# Patient Record
Sex: Female | Born: 1955 | Race: White | Hispanic: No | Smoking: Current every day smoker
Health system: Southern US, Community
[De-identification: ages and names within clinical notes are randomized; demographics above are authoritative.]

## PROBLEM LIST (undated history)

## (undated) DIAGNOSIS — F419 Anxiety disorder, unspecified: Secondary | ICD-10-CM

## (undated) DIAGNOSIS — I739 Peripheral vascular disease, unspecified: Secondary | ICD-10-CM

## (undated) DIAGNOSIS — M1990 Unspecified osteoarthritis, unspecified site: Secondary | ICD-10-CM

## (undated) DIAGNOSIS — I639 Cerebral infarction, unspecified: Secondary | ICD-10-CM

## (undated) DIAGNOSIS — I8290 Acute embolism and thrombosis of unspecified vein: Secondary | ICD-10-CM

## (undated) DIAGNOSIS — E039 Hypothyroidism, unspecified: Secondary | ICD-10-CM

## (undated) HISTORY — PX: ABDOMINAL HYSTERECTOMY: SHX81

## (undated) HISTORY — PX: APPENDECTOMY: SHX54

## (undated) HISTORY — PX: COSMETIC SURGERY: SHX468

---

## 2001-12-30 ENCOUNTER — Ambulatory Visit (HOSPITAL_COMMUNITY): Admission: RE | Admit: 2001-12-30 | Discharge: 2001-12-30 | Payer: Self-pay | Admitting: Neurology

## 2002-11-27 ENCOUNTER — Ambulatory Visit (HOSPITAL_COMMUNITY): Admission: RE | Admit: 2002-11-27 | Discharge: 2002-11-27 | Payer: Self-pay | Admitting: Neurology

## 2003-01-21 ENCOUNTER — Ambulatory Visit (HOSPITAL_COMMUNITY): Admission: RE | Admit: 2003-01-21 | Discharge: 2003-01-21 | Payer: Self-pay | Admitting: Neurology

## 2003-01-21 ENCOUNTER — Encounter: Payer: Self-pay | Admitting: Neurology

## 2003-03-18 ENCOUNTER — Ambulatory Visit (HOSPITAL_COMMUNITY): Admission: RE | Admit: 2003-03-18 | Discharge: 2003-03-18 | Payer: Self-pay | Admitting: Neurology

## 2003-03-18 ENCOUNTER — Encounter: Payer: Self-pay | Admitting: Neurology

## 2011-11-10 ENCOUNTER — Encounter (HOSPITAL_COMMUNITY): Payer: Self-pay | Admitting: Certified Registered"

## 2011-11-10 ENCOUNTER — Other Ambulatory Visit: Payer: Self-pay

## 2011-11-10 ENCOUNTER — Encounter (HOSPITAL_COMMUNITY)
Admission: EM | Disposition: A | Discharge status: Home / Self Care | Payer: Self-pay | Source: Home / Self Care | Attending: Orthopedic Surgery

## 2011-11-10 ENCOUNTER — Encounter (HOSPITAL_COMMUNITY): Payer: Self-pay | Admitting: Emergency Medicine

## 2011-11-10 ENCOUNTER — Inpatient Hospital Stay (HOSPITAL_COMMUNITY)
Admission: EM | Admit: 2011-11-10 | Discharge: 2011-11-13 | DRG: 482 | Disposition: A | Discharge status: Home / Self Care | Payer: Medicaid Other | Attending: Orthopedic Surgery | Admitting: Orthopedic Surgery

## 2011-11-10 ENCOUNTER — Emergency Department (HOSPITAL_COMMUNITY): Payer: Medicaid Other | Admitting: Certified Registered"

## 2011-11-10 DIAGNOSIS — M25559 Pain in unspecified hip: Secondary | ICD-10-CM | POA: Diagnosis present

## 2011-11-10 DIAGNOSIS — W19XXXA Unspecified fall, initial encounter: Secondary | ICD-10-CM | POA: Diagnosis present

## 2011-11-10 DIAGNOSIS — S72143A Displaced intertrochanteric fracture of unspecified femur, initial encounter for closed fracture: Principal | ICD-10-CM | POA: Diagnosis present

## 2011-11-10 DIAGNOSIS — Y998 Other external cause status: Secondary | ICD-10-CM

## 2011-11-10 DIAGNOSIS — Z8673 Personal history of transient ischemic attack (TIA), and cerebral infarction without residual deficits: Secondary | ICD-10-CM

## 2011-11-10 DIAGNOSIS — F17200 Nicotine dependence, unspecified, uncomplicated: Secondary | ICD-10-CM | POA: Diagnosis present

## 2011-11-10 DIAGNOSIS — S72142A Displaced intertrochanteric fracture of left femur, initial encounter for closed fracture: Secondary | ICD-10-CM

## 2011-11-10 HISTORY — PX: FEMUR IM NAIL: SHX1597

## 2011-11-10 HISTORY — DX: Cerebral infarction, unspecified: I63.9

## 2011-11-10 SURGERY — INSERTION, INTRAMEDULLARY ROD, FEMUR
Anesthesia: General | Site: Hip | Laterality: Left | Wound class: Clean

## 2011-11-10 MED ORDER — FENTANYL CITRATE 0.05 MG/ML IJ SOLN
INTRAMUSCULAR | Status: DC | PRN
  Administered 2011-11-10: 50 ug via INTRAVENOUS

## 2011-11-10 MED ORDER — MIDAZOLAM HCL 5 MG/5ML IJ SOLN
INTRAMUSCULAR | Status: DC | PRN
  Administered 2011-11-10: 1 mg via INTRAVENOUS

## 2011-11-10 MED ORDER — LACTATED RINGERS IV SOLN
INTRAVENOUS | Status: DC | PRN
  Administered 2011-11-10 – 2011-11-11 (×2): via INTRAVENOUS

## 2011-11-10 MED ORDER — CEFAZOLIN SODIUM 1-5 GM-% IV SOLN
INTRAVENOUS | Status: DC | PRN
  Administered 2011-11-10: 1 g via INTRAVENOUS

## 2011-11-10 MED ORDER — SUCCINYLCHOLINE CHLORIDE 20 MG/ML IJ SOLN
INTRAMUSCULAR | Status: DC | PRN
  Administered 2011-11-10: 80 mg via INTRAVENOUS

## 2011-11-10 MED ORDER — EPHEDRINE SULFATE 50 MG/ML IJ SOLN
INTRAMUSCULAR | Status: DC | PRN
  Administered 2011-11-10: 5 mg via INTRAVENOUS
  Administered 2011-11-10: 10 mg via INTRAVENOUS

## 2011-11-10 MED ORDER — ROCURONIUM BROMIDE 100 MG/10ML IV SOLN
INTRAVENOUS | Status: DC | PRN
  Administered 2011-11-10: 20 mg via INTRAVENOUS

## 2011-11-10 MED ORDER — PROPOFOL 10 MG/ML IV EMUL
INTRAVENOUS | Status: DC | PRN
  Administered 2011-11-10: 70 mg via INTRAVENOUS

## 2011-11-10 SURGICAL SUPPLY — 40 items
BANDAGE CONFORM 3  STR LF (GAUZE/BANDAGES/DRESSINGS) ×4 IMPLANT
BIT DRILL CANN LG 4.3MM (BIT) ×1 IMPLANT
BLADE SURG 15 STRL LF DISP TIS (BLADE) IMPLANT
BLADE SURG 15 STRL SS (BLADE)
CLOTH BEACON ORANGE TIMEOUT ST (SAFETY) ×2 IMPLANT
DRAPE STERI IOBAN 125X83 (DRAPES) ×2 IMPLANT
DRILL BIT CANN LG 4.3MM (BIT) ×2
DRSG ADAPTIC 3X8 NADH LF (GAUZE/BANDAGES/DRESSINGS) ×2 IMPLANT
DRSG MEPILEX BORDER 4X4 (GAUZE/BANDAGES/DRESSINGS) ×3 IMPLANT
DRSG MEPILEX BORDER 4X8 (GAUZE/BANDAGES/DRESSINGS) ×3 IMPLANT
ELECT REM PT RETURN 9FT ADLT (ELECTROSURGICAL) ×2
ELECTRODE REM PT RTRN 9FT ADLT (ELECTROSURGICAL) ×1 IMPLANT
GLOVE BIO SURGEON STRL SZ7.5 (GLOVE) ×2 IMPLANT
GLOVE BIO SURGEON STRL SZ8 (GLOVE) ×2 IMPLANT
GLOVE EUDERMIC 7 POWDERFREE (GLOVE) ×2 IMPLANT
GLOVE SS BIOGEL STRL SZ 7.5 (GLOVE) ×1 IMPLANT
GLOVE SUPERSENSE BIOGEL SZ 7.5 (GLOVE) ×1
GOWN STRL NON-REIN LRG LVL3 (GOWN DISPOSABLE) ×2 IMPLANT
GOWN STRL REIN XL XLG (GOWN DISPOSABLE) ×4 IMPLANT
GUIDEPIN 3.2X17.5 THRD DISP (PIN) ×1 IMPLANT
GUIDEWIRE BALL NOSE 100CM (WIRE) ×1 IMPLANT
KIT BASIN OR (CUSTOM PROCEDURE TRAY) ×2 IMPLANT
KIT ROOM TURNOVER OR (KITS) ×2 IMPLANT
MANIFOLD NEPTUNE II (INSTRUMENTS) ×2 IMPLANT
NAIL HIP FRACT 130D 9X180 (Orthopedic Implant) ×1 IMPLANT
NS IRRIG 1000ML POUR BTL (IV SOLUTION) ×2 IMPLANT
PACK GENERAL/GYN (CUSTOM PROCEDURE TRAY) ×2 IMPLANT
PAD ARMBOARD 7.5X6 YLW CONV (MISCELLANEOUS) ×4 IMPLANT
SCREW BONE CORTICAL 5.0X3 (Screw) ×1 IMPLANT
SCREW LAG HIP NAIL 10.5X95 (Screw) ×1 IMPLANT
SPONGE LAP 18X18 X RAY DECT (DISPOSABLE) ×2 IMPLANT
STAPLER VISISTAT 35W (STAPLE) ×4 IMPLANT
STRIP CLOSURE SKIN 1/2X4 (GAUZE/BANDAGES/DRESSINGS) ×2 IMPLANT
SUT MNCRL AB 3-0 PS2 18 (SUTURE) ×2 IMPLANT
SUT VIC AB 0 CT1 27 (SUTURE) ×2
SUT VIC AB 0 CT1 27XBRD ANBCTR (SUTURE) ×1 IMPLANT
SUT VIC AB 2-0 CT1 27 (SUTURE) ×2
SUT VIC AB 2-0 CT1 TAPERPNT 27 (SUTURE) ×1 IMPLANT
TRAY FOLEY CATH 14FR (SET/KITS/TRAYS/PACK) IMPLANT
WATER STERILE IRR 1000ML POUR (IV SOLUTION) ×2 IMPLANT

## 2011-11-10 NOTE — ED Notes (Signed)
Pt brought in via [facility] from [facility]. For left hip fx. Pt states no pain unless she moves the leg. States she tripped in a parking lot and fell.

## 2011-11-10 NOTE — ED Notes (Signed)
Dr. Rennis Chris here to assess patient for OR.  Jewelry and sports bra removed.  Remains NPO.

## 2011-11-10 NOTE — ED Provider Notes (Signed)
History     CSN: [id number]  Arrival date & time 11/10/11  1715   First MD Initiated Contact with Patient 11/10/11 1722      Chief Complaint  Patient presents with  . Hip Injury    (Consider location/radiation/quality/duration/timing/severity/associated sxs/prior treatment) Patient is a 56 y.o. female presenting with fall. The history is provided by the patient.  Fall The accident occurred 3 to 5 hours ago. The fall occurred while walking. She fell from a height of 3 to 5 ft. She landed on concrete. There was no blood loss. The point of impact was the left hip. The pain is present in the left hip. The pain is at a severity of 3/10. The pain is moderate. She was not ambulatory at the scene. There was no entrapment after the fall. Pertinent negatives include no fever, no abdominal pain, no nausea, no vomiting, no headaches and no loss of consciousness. The symptoms are aggravated by activity and use of the injured limb.    Past Medical History  Diagnosis Date  . Stroke     Past Surgical History  Procedure Date  . Abdominal hysterectomy   . Cosmetic surgery   . Appendectomy     History reviewed. No pertinent family history.  History  Substance Use Topics  . Smoking status: Current Everyday Smoker  . Smokeless tobacco: Not on file  . Alcohol Use: No    OB History    Grav Para Term Preterm Abortions TAB SAB Ect Mult Living                  Review of Systems  Constitutional: Negative for fever and chills.  HENT: Negative for congestion and rhinorrhea.   Respiratory: Negative for shortness of breath.   Cardiovascular: Negative for chest pain and leg swelling.  Gastrointestinal: Negative for nausea, vomiting, abdominal pain and constipation.  Genitourinary: Negative for urgency, decreased urine volume and difficulty urinating.  Musculoskeletal:       Left hip pain  Skin: Negative for wound.  Neurological: Negative for loss of consciousness, syncope, weakness and  headaches.  Hematological: Does not bruise/bleed easily.  Psychiatric/Behavioral: Negative for confusion.  All other systems reviewed and are negative.    Allergies  Amoxicillin  Home Medications   Current Outpatient Rx  Name Route Sig Dispense Refill  . DOXYLAMINE-DM 6.25-15 MG/15ML PO LIQD Oral Take 5 mLs by mouth daily as needed. For cough flu symptoms    . GUAIFENESIN ER 600 MG PO TB12 Oral Take 600 mg by mouth daily as needed. For allergies per patient    . MELATONIN 3 MG PO TABS Oral Take 1 tablet by mouth at bedtime.      BP 113/56  Pulse 69  Temp(Src) 98 F (36.7 C) (Oral)  Resp 8  SpO2 92%  Physical Exam  Nursing note and vitals reviewed. Constitutional: She is oriented to person, place, and time. She appears well-developed and well-nourished. No distress.  HENT:  Head: Normocephalic and atraumatic.  Right Ear: External ear normal.  Left Ear: External ear normal.  Nose: Nose normal.  Mouth/Throat: Oropharynx is clear and moist.  Eyes: Pupils are equal, round, and reactive to light.  Neck: Neck supple.  Cardiovascular: Normal rate, regular rhythm, normal heart sounds and intact distal pulses.   Pulmonary/Chest: Effort normal and breath sounds normal. No respiratory distress. She has no wheezes. She has no rales.  Abdominal: Soft. She exhibits no distension. There is no tenderness.  Musculoskeletal: She exhibits no edema.  Left hip: She exhibits decreased range of motion and tenderness. She exhibits no deformity and no laceration.       Legs:      Normal neurovascular status in left leg  Lymphadenopathy:    She has no cervical adenopathy.  Neurological: She is alert and oriented to person, place, and time.  Skin: Skin is warm and dry. She is not diaphoretic. No pallor.    ED Course  Procedures (including critical care time)  Labs Reviewed - No data to display No results found.   Date: 11/10/2011  Rate: 68  Rhythm: normal sinus rhythm  QRS Axis:  normal  Intervals: normal  ST/T Wave abnormalities: normal  Conduction Disutrbances:none  Narrative Interpretation:   Old EKG Reviewed: none available   1. Intertrochanteric fracture of left hip       MDM  56 yo female presents as transfer from [place] after a fall outside of a store. Left intertroch hip fracture. Accepted by Dr. Rennis Chris. Labs reveiwed from OSH, pertinent for anemia (Hgb 11.1) and hypokalemia (3.2). Pain controlled now, does not need more pain meds (3/10 at initial visit to our ED). Dr. Rennis Chris saw patient in ED. Will admit patient to ortho service, and will likely take to OR tonight. Xrays from outside hospital reviewed by myself. Doubt syncope as cause of her fall, but EKG shows no ischemic changes. No symptoms suggesting ACS as cause of fall.        Pricilla Loveless, MD 11/10/11 [phone]

## 2011-11-10 NOTE — Preoperative (Signed)
Beta Blockers   Reason not to administer Beta Blockers:Not Applicable 

## 2011-11-10 NOTE — Anesthesia Preprocedure Evaluation (Addendum)
Anesthesia Evaluation  Patient identified by MRN, date of birth, ID band Patient awake    Reviewed: Allergy & Precautions, H&P , NPO status , Patient's Chart, lab work & pertinent test results  History of Anesthesia Complications Negative for: history of anesthetic complications  Airway Mallampati: I TM Distance: >3 FB Neck ROM: Full    Dental  (+) Edentulous Upper and Edentulous Lower   Pulmonary Recent URI , Residual Cough, Current Smoker,  breath sounds clear to auscultation  Pulmonary exam normal       Cardiovascular negative cardio ROS  Rhythm:Regular Rate:Normal     Neuro/Psych TIAnegative psych ROS   GI/Hepatic negative GI ROS, Neg liver ROS,   Endo/Other  negative endocrine ROS  Renal/GU negative Renal ROS     Musculoskeletal   Abdominal   Peds  Hematology negative hematology ROS (+)   Anesthesia Other Findings   Reproductive/Obstetrics                           Anesthesia Physical Anesthesia Plan  ASA: II  Anesthesia Plan: General   Post-op Pain Management:    Induction: Intravenous and Rapid sequence  Airway Management Planned: Oral ETT  Additional Equipment:   Intra-op Plan:   Post-operative Plan: Extubation in OR  Informed Consent:   Plan Discussed with: CRNA and Surgeon  Anesthesia Plan Comments: (Plan routine monitors, GETA with RSI)        Anesthesia Quick Evaluation

## 2011-11-10 NOTE — H&P (Signed)
  PREOPERATIVE H&P  Chief Complaint: Left hip pain  HPI: Carrie Adams is a 56 y.o. female who presents for evaluation of left hip pain after a fall. This happened earlier today and she was evaluated at [facility] and transferred here for surgical management.  Past Medical History  Diagnosis Date  . Stroke    Past Surgical History  Procedure Date  . Abdominal hysterectomy   . Cosmetic surgery   . Appendectomy    History   Social History  . Marital Status: Single    Spouse Name: N/A    Number of Children: N/A  . Years of Education: N/A   Social History Main Topics  . Smoking status: Current Everyday Smoker  . Smokeless tobacco: None  . Alcohol Use: No  . Drug Use: No  . Sexually Active:    Other Topics Concern  . None   Social History Narrative  . None   History reviewed. No pertinent family history. Allergies  Allergen Reactions  . Amoxicillin Nausea And Vomiting   Prior to Admission medications   Medication Sig Start Date End Date Taking? Authorizing Provider  Doxylamine-DM (NIGHT-TIME COUGH) 6.25-15 MG/15ML LIQD Take 5 mLs by mouth daily as needed. For cough flu symptoms   Yes Historical Provider, MD  guaiFENesin (MUCINEX) 600 MG 12 hr tablet Take 600 mg by mouth daily as needed. For allergies per patient   Yes Historical Provider, MD  Melatonin 3 MG TABS Take 1 tablet by mouth at bedtime.   Yes Historical Provider, MD     Positive ROS: denies any recent health issues  All other systems have been reviewed and were otherwise negative with the exception of those mentioned in the HPI and as above.  Physical Exam: Filed Vitals:   11/10/11 2015  BP: 104/58  Pulse: 68  Temp:   Resp: 10    General: Alert, no acute distress Cardiovascular: No pedal edema Respiratory: No cyanosis, no use of accessory musculature GI: No organomegaly, abdomen is soft and non-tender Skin: No lesions in the area of chief complaint Neurologic: Sensation intact  distally Psychiatric: Patient is competent for consent with normal mood and affect Lymphatic: No axillary or cervical lymphadenopathy  MUSCULOSKELETAL: Left hip guarded and painful with any attempted movement. Does tolerate foot and ankle DF/PF and sensation is intct  Assessment/Plan: Left intertroch hip fracture Plan for Procedure(s): INTRAMEDULLARY (IM) NAIL FEMORAL  The risks benefits and treatment options were reviewed including possible complications of bleeding, infection, neurovascular injury, ongoing pain, anesthetic complication, and possible need for additional surgery. The patient understands, accepts and agrees to proceed with the planned procedure.  SHUFORD,TRACY, PA 11/10/2011 9:17 PM

## 2011-11-10 NOTE — ED Notes (Signed)
Report called to Anesthesia and patient transported to OR.

## 2011-11-11 ENCOUNTER — Emergency Department (HOSPITAL_COMMUNITY): Payer: Medicaid Other

## 2011-11-11 LAB — CBC
HCT: 31.2 % — ABNORMAL LOW (ref 36.0–46.0)
Hemoglobin: 10 g/dL — ABNORMAL LOW (ref 12.0–15.0)
MCH: 28.6 pg (ref 26.0–34.0)
MCHC: 32.1 g/dL (ref 30.0–36.0)
MCV: 89.1 fL (ref 78.0–100.0)
Platelets: 288 10*3/uL (ref 150–400)
RBC: 3.5 MIL/uL — ABNORMAL LOW (ref 3.87–5.11)
RDW: 15.9 % — ABNORMAL HIGH (ref 11.5–15.5)
WBC: 11.8 10*3/uL — ABNORMAL HIGH (ref 4.0–10.5)

## 2011-11-11 MED ORDER — METHOCARBAMOL 100 MG/ML IJ SOLN
500.0000 mg | Freq: Four times a day (QID) | INTRAMUSCULAR | Status: DC | PRN
  Filled 2011-11-11: qty 5

## 2011-11-11 MED ORDER — OXYCODONE-ACETAMINOPHEN 5-325 MG PO TABS
1.0000 | ORAL_TABLET | ORAL | Status: DC | PRN
  Administered 2011-11-11: 2 via ORAL
  Administered 2011-11-11: 1 via ORAL
  Administered 2011-11-11 – 2011-11-13 (×9): 2 via ORAL
  Filled 2011-11-11 (×11): qty 2

## 2011-11-11 MED ORDER — DIPHENHYDRAMINE HCL 25 MG PO CAPS
25.0000 mg | ORAL_CAPSULE | Freq: Four times a day (QID) | ORAL | Status: DC | PRN
  Administered 2011-11-12: 25 mg via ORAL
  Filled 2011-11-11: qty 1

## 2011-11-11 MED ORDER — GLYCOPYRROLATE 0.2 MG/ML IJ SOLN
INTRAMUSCULAR | Status: DC | PRN
  Administered 2011-11-11: .4 mg via INTRAVENOUS

## 2011-11-11 MED ORDER — METOCLOPRAMIDE HCL 10 MG PO TABS: 5.0000 mg | ORAL_TABLET | Freq: Three times a day (TID) | ORAL | Status: DC | PRN

## 2011-11-11 MED ORDER — SODIUM CHLORIDE 0.9 % IV SOLN: INTRAVENOUS | Status: DC

## 2011-11-11 MED ORDER — METHOCARBAMOL 100 MG/ML IJ SOLN: 500.0000 mg | Freq: Four times a day (QID) | INTRAMUSCULAR | Status: DC | PRN

## 2011-11-11 MED ORDER — HYDROMORPHONE HCL 1 MG/ML IJ SOLN
0.5000 mg | INTRAMUSCULAR | Status: DC | PRN
  Administered 2011-11-11: 0.5 mg via INTRAVENOUS
  Filled 2011-11-11: qty 1

## 2011-11-11 MED ORDER — HYDROCODONE-ACETAMINOPHEN 5-325 MG PO TABS: 1.0000 | ORAL_TABLET | ORAL | Status: DC | PRN

## 2011-11-11 MED ORDER — METHOCARBAMOL 500 MG PO TABS
500.0000 mg | ORAL_TABLET | Freq: Four times a day (QID) | ORAL | Status: DC | PRN
Start: 2011-11-11 — End: 2011-11-11

## 2011-11-11 MED ORDER — CEFAZOLIN SODIUM 1-5 GM-% IV SOLN
1.0000 g | Freq: Four times a day (QID) | INTRAVENOUS | Status: AC
  Administered 2011-11-11 (×3): 1 g via INTRAVENOUS
  Filled 2011-11-11 (×3): qty 50

## 2011-11-11 MED ORDER — METHOCARBAMOL 500 MG PO TABS
500.0000 mg | ORAL_TABLET | Freq: Four times a day (QID) | ORAL | Status: DC | PRN
  Administered 2011-11-11 – 2011-11-12 (×4): 500 mg via ORAL
  Filled 2011-11-11 (×4): qty 1

## 2011-11-11 MED ORDER — ONDANSETRON HCL 4 MG PO TABS: 4.0000 mg | ORAL_TABLET | Freq: Four times a day (QID) | ORAL | Status: DC | PRN

## 2011-11-11 MED ORDER — DEXTROSE-NACL 5-0.45 % IV SOLN
INTRAVENOUS | Status: DC
  Administered 2011-11-11: 03:00:00 via INTRAVENOUS

## 2011-11-11 MED ORDER — HYDROMORPHONE HCL PF 1 MG/ML IJ SOLN: 0.2500 mg | INTRAMUSCULAR | Status: DC | PRN

## 2011-11-11 MED ORDER — PHENOL 1.4 % MT LIQD: 1.0000 | OROMUCOSAL | Status: DC | PRN

## 2011-11-11 MED ORDER — ACETAMINOPHEN 325 MG PO TABS: 650.0000 mg | ORAL_TABLET | Freq: Four times a day (QID) | ORAL | Status: DC | PRN

## 2011-11-11 MED ORDER — MORPHINE SULFATE 2 MG/ML IJ SOLN: 0.5000 mg | INTRAMUSCULAR | Status: DC | PRN

## 2011-11-11 MED ORDER — NEOSTIGMINE METHYLSULFATE 1 MG/ML IJ SOLN
INTRAMUSCULAR | Status: DC | PRN
  Administered 2011-11-11: 2 mg via INTRAVENOUS

## 2011-11-11 MED ORDER — ONDANSETRON HCL 4 MG/2ML IJ SOLN: 4.0000 mg | Freq: Four times a day (QID) | INTRAMUSCULAR | Status: DC | PRN

## 2011-11-11 MED ORDER — DOCUSATE SODIUM 100 MG PO CAPS
100.0000 mg | ORAL_CAPSULE | Freq: Two times a day (BID) | ORAL | Status: DC
  Administered 2011-11-11 – 2011-11-13 (×4): 100 mg via ORAL
  Filled 2011-11-11 (×6): qty 1

## 2011-11-11 MED ORDER — ACETAMINOPHEN 650 MG RE SUPP: 650.0000 mg | Freq: Four times a day (QID) | RECTAL | Status: DC | PRN

## 2011-11-11 MED ORDER — ZOLPIDEM TARTRATE 5 MG PO TABS: 5.0000 mg | ORAL_TABLET | Freq: Every evening | ORAL | Status: DC | PRN

## 2011-11-11 MED ORDER — ENOXAPARIN SODIUM 40 MG/0.4ML ~~LOC~~ SOLN
40.0000 mg | SUBCUTANEOUS | Status: DC
  Administered 2011-11-11 – 2011-11-12 (×2): 40 mg via SUBCUTANEOUS
  Filled 2011-11-11 (×3): qty 0.4

## 2011-11-11 MED ORDER — ONDANSETRON HCL 4 MG/2ML IJ SOLN
INTRAMUSCULAR | Status: DC | PRN
  Administered 2011-11-11: 4 mg via INTRAVENOUS

## 2011-11-11 MED ORDER — MENTHOL 3 MG MT LOZG: 1.0000 | LOZENGE | OROMUCOSAL | Status: DC | PRN

## 2011-11-11 MED ORDER — METOCLOPRAMIDE HCL 5 MG/ML IJ SOLN: 5.0000 mg | Freq: Three times a day (TID) | INTRAMUSCULAR | Status: DC | PRN

## 2011-11-11 NOTE — Anesthesia Procedure Notes (Signed)
Procedure Name: Intubation Date/Time: 11/10/2011 11:34 PM Performed by: Julianne Rice Z Pre-anesthesia Checklist: Patient identified, Timeout performed, Emergency Drugs available, Suction available and Patient being monitored Patient Re-evaluated:Patient Re-evaluated prior to inductionOxygen Delivery Method: Circle system utilized Preoxygenation: Pre-oxygenation with 100% oxygen Intubation Type: IV induction Ventilation: Mask ventilation without difficulty Laryngoscope Size: Mac and 3 Grade View: Grade I Tube type: Oral Number of attempts: 1 Airway Equipment and Method: Stylet and LTA kit utilized Placement Confirmation: ETT inserted through vocal cords under direct vision,  breath sounds checked- equal and bilateral and positive ETCO2 Secured at: 22 cm Tube secured with: Tape Dental Injury: Teeth and Oropharynx as per pre-operative assessment

## 2011-11-11 NOTE — ED Provider Notes (Signed)
I saw and evaluated the patient, reviewed the resident's note and I agree with the findings and plan. Pt s/p fall, hip pain, it fx on xrays from outside facility. Ortho called.   Suzi Roots, MD 11/11/11 1538

## 2011-11-11 NOTE — Op Note (Signed)
Carrie Adams, Carrie Adams NO.:  [id number]  MEDICAL RECORD NO.:  [id number]  LOCATION:  5006                         FACILITY:  [facility]  PHYSICIAN:  Vania Rea. Kerah Hardebeck, M.D.  DATE OF BIRTH:  09/03/1955  DATE OF PROCEDURE:  11/11/2011 DATE OF DISCHARGE:                              OPERATIVE REPORT   PREOPERATIVE DIAGNOSIS:  Left intertrochanteric hip fracture.  POSTOPERATIVE DIAGNOSIS:  Left intertrochanteric hip fracture.  PROCEDURE:  Open reduction and internal fixation of left intertrochanteric hip fracture utilizing intramedullary hip screw.  SURGEON:  Vania Rea. Aleka Twitty, M.D.  Threasa HeadsFrench Ana A. Adams, P.A.-C.  ANESTHESIA:  General endotracheal.  ESTIMATED BLOOD LOSS:  300 mL.  DRAINS:  None.  HISTORY:  Ms. Carrie Adams is a 56 year old female who fell earlier today, injuring the left hip with immediate complaints of pain and inability to bear weight.  She was taken [facility] where radiographs showed a left intertrochanteric hip fracture.  There was no orthopedic coverage available, so she was transferred to [facility] where upon evaluation, she was found to have diffuse tenderness with palpation of the left hip with pain on motion. The radiographs did confirm a left intertrochanteric hip fracture.  She is brought to the operating room at this time for planned open reduction and internal fixation.  Preoperatively, I counseled Ms. Carrie Adams on treatment options as well as risks versus benefits thereof.  Possible surgical complications were reviewed including potential for bleeding, infection, neurovascular injury, DVT, PE, malunion, nonunion, loss of fixation, and possible need for additional surgery.  She understands and accepts and agrees with our planned procedure.  PROCEDURE IN DETAIL:  After undergoing routine preop evaluation, the patient received prophylactic antibiotics.  Brought to the operating room on hospital bed and underwent smooth  induction of a general endotracheal anesthesia.  She was then transferred to the fracture table in supine position and appropriately padded and protected.  Left leg was placed in a gentle longitudinal traction, and right leg was placed in a well leg holder and appropriately padded and protected.  Fluoroscopic images were then obtained, which confirmed an appropriate reduction of the fracture site.  The left hip girdle region was then sterilely prepped and draped in standard fashion.  Time-out was called.  A 4-cm lateral incision was made just proximal to the tip of the greater trochanter with dissection carried sharply down through skin and subcu and deep fascia.  I then gained access to the tip of the greater trochanter, which was approach with a starting awl, which was introduced into the proximal femur with proper positioning confirmed fluoroscopically.  A ball-tip guidewire was then directed into the femoral shaft, and proper positioning was then confirmed fluoroscopically.  This was then reamed with appropriate starter reamer. Then over the guidewire, we passed the 9 mm intramedullary hip screw from [employer].  It seated to the appropriate depth.  The guidewire was removed.  Then using the out rigger arm, we made a 1-cm stab wound to introduce the cannula for the femoral head and neck screw.  The guide pin was then placed up in the femoral neck to the appropriate depth and proper position confirmed fluoroscopically.  We then drilled  and then passed a 95-mm lag screw.  Bony purchase was excellent. Overall position was to our satisfaction.  This was placed into the static locked mode. We then used a guide to place a single static distal locking screw with position confirmed fluoroscopically using standard technique.  The outrigger was removed, then we confirmed proper positioning of the implant, in good position of the fracture site both AP and lateral views.  All the wounds were then  irrigated.  Closed with 2-0 Vicryl subcu and intracuticular 3-0 Monocryl for the skin and Steri-Strips followed by dry dressings.  The patient was then removed from traction, placed supine on the fracture table, awakened, extubated and taken to the recovery room in stable condition.     Vania Rea. Haakon Titsworth, M.D.     KMS/MEDQ  D:  11/11/2011  T:  11/11/2011  Job:  [id number]

## 2011-11-11 NOTE — Progress Notes (Signed)
Subjective: Sore left hip, no SOB,No CP, No nausea. Hoarse voice   Objective: Vital signs in last 24 hours: Temp:  [97.3 F (36.3 C)-98.4 F (36.9 C)] 98.4 F (36.9 C) (03/16 0527) Pulse Rate:  [67-87] 87  (03/16 0527) Resp:  [8-22] 18  (03/16 0527) BP: (94-122)/(43-107) 97/52 mmHg (03/16 0527) SpO2:  [2 %-100 %] 100 % (03/16 0527) Weight:  [56.7 kg (125 lb)] 56.7 kg (125 lb) (03/16 0113)  Intake/Output from previous day: 03/15 0701 - 03/16 0700 In: 1825 [P.O.:200; I.V.:1625] Out: 825 [Urine:575; Blood:250] Intake/Output this shift:     Basename 11/11/11 0723  HGB 10.0*    Basename 11/11/11 0723  WBC 11.8*  RBC 3.50*  HCT 31.2*  PLT 288   No results found for this basename: NA:2,K:2,CL:2,CO2:2,BUN:2,CREATININE:2,GLUCOSE:2,CALCIUM:2 in the last 72 hours No results found for this basename: LABPT:2,INR:2 in the last 72 hours  Pt cons alert approp, speaks with hoarse voice appears comfortable. Left hip dressing intact, thigh soft, calf soft NT, leg NMVI  Assessment/Plan: POD #1 IM nail left hip fx doing well. Hoarse voice. Lozengers Will monitor. Plan OOB with PT, dressing change Sunday   Jamelle Rushing 11/11/2011, 10:24 AM

## 2011-11-11 NOTE — Transfer of Care (Signed)
Immediate Anesthesia Transfer of Care Note  Patient: Carrie Adams  Procedure(s) Performed: Procedure(s) (LRB): INTRAMEDULLARY (IM) NAIL FEMORAL (Left)  Patient Location: PACU  Anesthesia Type: General  Level of Consciousness: awake, alert  and oriented  Airway & Oxygen Therapy: Patient Spontanous Breathing and Patient connected to nasal cannula oxygen  Post-op Assessment: Report given to PACU RN and Post -op Vital signs reviewed and stable  Post vital signs: Reviewed and stable  Complications: No apparent anesthesia complications

## 2011-11-11 NOTE — Op Note (Signed)
11/10/2011 - 11/11/2011  12:25 AM  PATIENT:   Carrie Adams  56 y.o. female  PRE-OPERATIVE DIAGNOSIS:  Left intertroch hip fracture  POST-OPERATIVE DIAGNOSIS:  same  PROCEDURE:  ORIF with IMHS  SURGEON:  Cache Bills, Vania Rea. M.D.  ASSISTANTS: Shuford pac   ANESTHESIA:   GET  EBL: 300  SPECIMEN:  none  Drains: none   PATIENT DISPOSITION:  PACU - hemodynamically stable.    PLAN OF CARE: Admit to inpatient  WBAT,24 hr abx, lovenox while inpatient  Dictation# [id number]

## 2011-11-11 NOTE — Evaluation (Signed)
Physical Therapy Evaluation Patient Details Name: Carrie Adams MRN: [id number] DOB: [birth year] Today's Date: 11/11/2011  Problem List: There is no problem list on file for this patient.   Past Medical History:  Past Medical History  Diagnosis Date  . Stroke    Past Surgical History:  Past Surgical History  Procedure Date  . Abdominal hysterectomy   . Cosmetic surgery   . Appendectomy     PT Assessment/Plan/Recommendation PT Assessment Clinical Impression Statement: Pt fell in a parking lot 11-10-11 resulting in a hip fx.  She underwent IM nailing.  D/C plan is home with familyt assist.  PT intervention indicated to progress mobility/gait and provide education for safe d/c home with LTG of independence. PT Recommendation/Assessment: Patient will need skilled PT in the acute care venue PT Problem List: Decreased activity tolerance;Decreased mobility;Pain;Decreased knowledge of use of DME;Decreased knowledge of precautions;Decreased strength Barriers to Discharge: None PT Therapy Diagnosis : Difficulty walking;Acute pain PT Plan PT Frequency: Min 6X/week PT Treatment/Interventions: DME instruction;Gait training;Stair training;Functional mobility training;Therapeutic activities;Therapeutic exercise;Patient/family education PT Recommendation Recommendations for Other Services: OT consult Follow Up Recommendations: Home health PT Equipment Recommended: Rolling walker with 5" wheels PT Goals  Acute Rehab PT Goals PT Goal Formulation: With patient Time For Goal Achievement: 7 days Pt will go Supine/Side to Sit: with modified independence PT Goal: Supine/Side to Sit - Progress: Goal set today Pt will go Sit to Supine/Side: with modified independence PT Goal: Sit to Supine/Side - Progress: Goal set today Pt will go Sit to Stand: with modified independence PT Goal: Sit to Stand - Progress: Goal set today Pt will go Stand to Sit: with modified independence PT Goal: Stand to Sit -  Progress: Goal set today Pt will Transfer Bed to Chair/Chair to Bed: with supervision PT Transfer Goal: Bed to Chair/Chair to Bed - Progress: Goal set today Pt will Ambulate: 51 - 150 feet;with supervision PT Goal: Ambulate - Progress: Goal set today Pt will Go Up / Down Stairs: 1-2 stairs;with least restrictive assistive device;with min assist PT Goal: Up/Down Stairs - Progress: Goal set today  PT Evaluation Precautions/Restrictions  Restrictions LLE Weight Bearing: Weight bearing as tolerated Prior Functioning  Home Living Lives With: Family Receives Help From: Family Type of Home: House Home Layout: One level Home Access: Stairs to enter Entrance Stairs-Rails: None Entrance Stairs-Number of Steps: 1 Home Adaptive Equipment: None Prior Function Level of Independence: Independent with basic ADLs;Independent with homemaking with ambulation;Independent with gait;Independent with transfers Driving: Yes Cognition Cognition Arousal/Alertness: Awake/alert Overall Cognitive Status: Appears within functional limits for tasks assessed Orientation Level: Oriented X4 Sensation/Coordination   Extremity Assessment   Mobility (including Balance) Bed Mobility Bed Mobility: Yes Supine to Sit: 4: Min assist Supine to Sit Details (indicate cue type and reason): assist with LLE, verbal cues for sequencing Transfers Transfers: Yes Sit to Stand: 3: Mod assist;From bed;With upper extremity assist Sit to Stand Details (indicate cue type and reason): verbal cues for hand placement Stand to Sit: 4: Min assist Stand to Sit Details: verbal cues for sequencing, assist to control descent Ambulation/Gait Ambulation/Gait: Yes Ambulation/Gait Assistance: 4: Min assist Ambulation/Gait Assistance Details (indicate cue type and reason): verbal cues for sequencing Ambulation Distance (Feet): 5 Feet Assistive device: Rolling walker Gait Pattern: Step-to pattern;Decreased stance time -  left;Antalgic Gait velocity: decreased    Exercise    End of Session PT - End of Session Equipment Utilized During Treatment: Gait belt Activity Tolerance: Patient limited by pain Patient left: in chair;with  call bell in reach Nurse Communication: Mobility status for transfers;Mobility status for ambulation;Other (comment) (pain meds requested) General Behavior During Session: [facility] for tasks performed Cognition: [facility] for tasks performed  Ilda Foil 11/11/2011, 12:38 PM  Aida Raider, PT  Office # [phone] Pager [phone]

## 2011-11-11 NOTE — Anesthesia Postprocedure Evaluation (Signed)
  Anesthesia Post-op Note  Patient: Carrie Adams  Procedure(s) Performed: Procedure(s) (LRB): INTRAMEDULLARY (IM) NAIL FEMORAL (Left)  Patient Location: PACU  Anesthesia Type: General  Level of Consciousness: awake, alert  and oriented  Airway and Oxygen Therapy: Patient Spontanous Breathing  Post-op Pain: none  Post-op Assessment: Post-op Vital signs reviewed, Patient's Cardiovascular Status Stable, Respiratory Function Stable, Patent Airway, No signs of Nausea or vomiting and Pain level controlled  Post-op Vital Signs: Reviewed and stable  Complications: No apparent anesthesia complications

## 2011-11-12 LAB — CBC
HCT: 27.7 % — ABNORMAL LOW (ref 36.0–46.0)
Hemoglobin: 9 g/dL — ABNORMAL LOW (ref 12.0–15.0)
MCH: 28.5 pg (ref 26.0–34.0)
MCHC: 32.5 g/dL (ref 30.0–36.0)
MCV: 87.7 fL (ref 78.0–100.0)
Platelets: 222 10*3/uL (ref 150–400)
RBC: 3.16 MIL/uL — ABNORMAL LOW (ref 3.87–5.11)
RDW: 15.4 % (ref 11.5–15.5)
WBC: 6.7 10*3/uL (ref 4.0–10.5)

## 2011-11-12 NOTE — Evaluation (Signed)
Occupational Therapy Evaluation Patient Details Name: Carrie Adams MRN: [id number] DOB: July 15, 1956 Today's Date: 11/12/2011  Problem List: There is no problem list on file for this patient.   Past Medical History:  Past Medical History  Diagnosis Date  . Stroke    Past Surgical History:  Past Surgical History  Procedure Date  . Abdominal hysterectomy   . Cosmetic surgery   . Appendectomy     OT Assessment/Plan/Recommendation OT Assessment Clinical Impression Statement: This 56 y.o. female admitted after fall resulting in hip fracture and s/p ORIF.  Pt. requires supervision with all BADLs, including functional transfers.  Anticipate she will transition to modified I fairly quickly.  All education completed.  No further OT needs identified.   OT Recommendation/Assessment: Patient does not need any further OT services OT Recommendation Follow Up Recommendations: No OT follow up Equipment Recommended: None recommended by OT OT Goals    OT Evaluation Precautions/Restrictions  Restrictions Weight Bearing Restrictions: Yes LLE Weight Bearing: Weight bearing as tolerated Prior Functioning Home Living Lives With: Family (sister, and brother) Dolores Lory Help From: Family Type of Home: House Home Layout: One level Home Access: Stairs to enter Entrance Stairs-Rails: None Secretary/administrator of Steps: 1 Bathroom Shower/Tub: Health visitor: Standard (with vanity and grab bar) Bathroom Accessibility: Yes How Accessible: Accessible via walker Home Adaptive Equipment: Grab bars around toilet Prior Function Level of Independence: Independent with basic ADLs;Independent with homemaking with ambulation;Independent with gait;Independent with transfers Able to Take Stairs?: Reciprically Driving: Yes ADL ADL Eating/Feeding: Simulated;Independent Where Assessed - Eating/Feeding: Chair Grooming: Simulated;Wash/dry hands;Wash/dry face;Teeth  care;Supervision/safety Where Assessed - Grooming: Standing at sink Upper Body Bathing: Simulated;Set up Where Assessed - Upper Body Bathing: Sitting, chair Lower Body Bathing: Simulated;Supervision/safety Where Assessed - Lower Body Bathing: Sit to stand from chair;Sit to stand from bed Upper Body Dressing: Simulated;Set up Where Assessed - Upper Body Dressing: Sitting, chair Lower Body Dressing: Simulated;Supervision/safety;Performed (socks) Where Assessed - Lower Body Dressing: Sit to stand from chair;Sit to stand from bed Toilet Transfer: Performed;Supervision/safety Toilet Transfer Method: Proofreader: Regular height toilet;Grab bars Toileting - Clothing Manipulation: Simulated;Supervision/safety Where Assessed - Toileting Clothing Manipulation: Standing Toileting - Hygiene: Simulated;Modified independent Where Assessed - Toileting Hygiene: Sit on 3-in-1 or toilet Tub/Shower Transfer: Simulated (min guard assist) Tub/Shower Transfer Details (indicate cue type and reason): Pt. instructed in proper technique and she will use outdoor patio chair with non-skid shower mat. Tub/Shower Transfer Method: Ecologist with back Equipment Used: Rolling walker Ambulation Related to ADLs: Pt. ambulates in room with supervision ADL Comments: Pt. moving very well - very determined.  Pt. progressing very well.  She currently requires supervision for BADLS, except min guard assist for shower tranfer.   Vision/Perception    Cognition Cognition Arousal/Alertness: Awake/alert Overall Cognitive Status: Appears within functional limits for tasks assessed Orientation Level: Oriented X4 Sensation/Coordination Sensation Light Touch: Appears Intact Coordination Gross Motor Movements are Fluid and Coordinated: Yes Fine Motor Movements are Fluid and Coordinated: Yes Extremity Assessment RUE Assessment RUE Assessment: Within Functional  Limits LUE Assessment LUE Assessment: Within Functional Limits Mobility  Bed Mobility Bed Mobility: Yes Supine to Sit: 6: Modified independent (Device/Increase time);HOB flat Transfers Transfers: Yes Sit to Stand: Supervision with UE assist  Stand to Sit::  Supervision with UE assist Exercises   End of Session OT - End of Session Activity Tolerance: Patient tolerated treatment well Patient left: in chair;with call bell in reach General Behavior During Session: [facility] for  tasks performed Cognition: [facility] for tasks performed   Kyandre Okray, Ursula Alert M 11/12/2011, 3:47 PM

## 2011-11-12 NOTE — Progress Notes (Signed)
Subjective: 2 Days Post-Op Procedure(s) (LRB): INTRAMEDULLARY (IM) NAIL FEMORAL (Left) Patient reports pain as mild.  Patient doing really well, seen in rounds with Dr Shelle Iron today. Denies CP or SOB.  Voiding without difficulty. Positive flatus. Objective: Vital signs in last 24 hours: Temp:  [97.9 F (36.6 C)-99.7 F (37.6 C)] 98.8 F (37.1 C) (03/17 0455) Pulse Rate:  [71-84] 84  (03/17 0455) Resp:  [16-18] 18  (03/17 0455) BP: (105-118)/(28-60) 118/60 mmHg (03/17 0455) SpO2:  [92 %-98 %] 92 % (03/17 0455)  Intake/Output from previous day: 03/16 0701 - 03/17 0700 In: 640 [P.O.:640] Out: 700 [Urine:700] Intake/Output this shift:     Basename 11/12/11 0617 11/11/11 0723  HGB 9.0* 10.0*    Basename 11/12/11 0617 11/11/11 0723  WBC 6.7 11.8*  RBC 3.16* 3.50*  HCT 27.7* 31.2*  PLT 222 288   No results found for this basename: NA:2,K:2,CL:2,CO2:2,BUN:2,CREATININE:2,GLUCOSE:2,CALCIUM:2 in the last 72 hours No results found for this basename: LABPT:2,INR:2 in the last 72 hours  Neurologically intact Neurovascular intact Sensation intact distally Dorsiflexion/Plantar flexion intact Incision: dressing C/D/I Compartment soft  Assessment/Plan: 2 Days Post-Op Procedure(s) (LRB): INTRAMEDULLARY (IM) NAIL FEMORAL (Left) Up with therapy D/C IV fluids  Genine Beckett R. 11/12/2011, 8:36 AM

## 2011-11-12 NOTE — Progress Notes (Signed)
Physical Therapy Treatment Patient Details Name: JANEENE SAND MRN: [id number] DOB: 19-Feb-1956 Today's Date: 11/12/2011  PT Assessment/Plan  PT - Assessment/Plan Comments on Treatment Session: Pt making good progress with mobility & PT goals.  Increased ambulation distance today.  Pt reports plans are for d/c home tomorrow- pt will need to perform stairs before d/cing home.   PT Plan: Discharge plan remains appropriate PT Frequency: Min 6X/week Follow Up Recommendations: Home health PT Equipment Recommended: Rolling walker with 5" wheels PT Goals  Acute Rehab PT Goals PT Goal: Supine/Side to Sit - Progress: Not met PT Goal: Sit to Supine/Side - Progress: Not met PT Goal: Sit to Stand - Progress: Progressing toward goal PT Goal: Stand to Sit - Progress: Progressing toward goal PT Goal: Ambulate - Progress: Progressing toward goal  PT Treatment Precautions/Restrictions  Restrictions Weight Bearing Restrictions: Yes LLE Weight Bearing: Weight bearing as tolerated Mobility (including Balance) Bed Mobility Supine to Sit: 4: Min assist Supine to Sit Details (indicate cue type and reason): (A) for Lt leg; cues for technique & use of UE's to increase ease of transition Sit to Supine: 4: Min assist;HOB flat Sit to Supine - Details (indicate cue type and reason): (A) for Lt leg & cues for technique.   Transfers Sit to Stand: 4: Min assist;From bed Sit to Stand Details (indicate cue type and reason): cues for hand placement & technique.  (A) to achieve standing, balance, & safety Stand to Sit: Other (comment) (Min Guard (A)) Stand to Sit Details: cues for hand placement & technique. Ambulation/Gait Ambulation/Gait: Yes Ambulation/Gait Assistance: Other (comment) (Min Guard (A)) Ambulation/Gait Assistance Details (indicate cue type and reason): Cues for sequencing, safe use of RW, advancement of RW Ambulation Distance (Feet): 50 Feet Assistive device: Rolling walker Gait Pattern:  Step-to pattern;Decreased step length - right;Decreased stance time - left;Decreased hip/knee flexion - left;Decreased weight shift to left Stairs: No Wheelchair Mobility Wheelchair Mobility: No  Posture/Postural Control Posture/Postural Control: No significant limitations Balance Balance Assessed: No Exercise  General Exercises - Lower Extremity Ankle Circles/Pumps: AROM;Both;10 reps;Supine Long Arc Quad: Left;10 reps;Seated;Strengthening;AROM Heel Slides: AAROM;Strengthening;Left;10 reps;Supine Hip Flexion/Marching: AAROM;Strengthening;Left;10 reps;Seated End of Session PT - End of Session Equipment Utilized During Treatment: Gait belt Activity Tolerance: Patient tolerated treatment well Patient left: in bed;with call bell in reach;with family/visitor present General Behavior During Session: [facility] for tasks performed Cognition: [facility] for tasks performed  Lara Mulch 11/12/2011, 10:24 AM [phone]

## 2011-11-13 LAB — CBC
HCT: 27.9 % — ABNORMAL LOW (ref 36.0–46.0)
Hemoglobin: 9 g/dL — ABNORMAL LOW (ref 12.0–15.0)
MCH: 28.7 pg (ref 26.0–34.0)
MCHC: 32.3 g/dL (ref 30.0–36.0)
MCV: 88.9 fL (ref 78.0–100.0)
Platelets: 225 10*3/uL (ref 150–400)
RBC: 3.14 MIL/uL — ABNORMAL LOW (ref 3.87–5.11)
RDW: 15.3 % (ref 11.5–15.5)
WBC: 5.8 10*3/uL (ref 4.0–10.5)

## 2011-11-13 MED ORDER — METHOCARBAMOL 500 MG PO TABS: 500.0000 mg | ORAL_TABLET | Freq: Four times a day (QID) | ORAL | Status: AC | PRN

## 2011-11-13 MED ORDER — OXYCODONE-ACETAMINOPHEN 5-325 MG PO TABS: 1.0000 | ORAL_TABLET | ORAL | Status: AC | PRN

## 2011-11-13 NOTE — Progress Notes (Signed)
Physical Therapy Treatment Note   11/13/11 0808  PT Visit Information  Last PT Received On 11/13/11  Restrictions  LLE Weight Bearing WBAT  Bed Mobility  Bed Mobility (pt received sitting up in chair upon arrival)  Transfers  Sit to Stand 5: Supervision  Sit to Stand Details (indicate cue type and reason) increased time required but demonstrated safe technique  Stand to Sit 5: Supervision  Stand to Sit Details increased time required but demonstrated safe technique  Ambulation/Gait  Ambulation/Gait Yes  Ambulation/Gait Assistance 5: Supervision  Ambulation/Gait Assistance Details (indicate cue type and reason) verbal cues for sequencing  Ambulation Distance (Feet) 60 Feet (x2, to/from gym)  Assistive device Rolling walker  Gait Pattern Step-to pattern;Decreased step length - left;Decreased stance time - left;Antalgic  Gait velocity increased time required but [facility] for injury  Stairs Yes  Stairs Assistance 4: Min assist  Stairs Assistance Details (indicate cue type and reason) minA for walker management due to going up backwards with RW to mimic home set up, hand out provided  Stair Management Technique Backwards;With walker  Number of Stairs 4   Height of Stairs 8   Wheelchair Mobility  Wheelchair Mobility No  Posture/Postural Control  Posture/Postural Control No significant limitations  Balance  Balance Assessed No  Exercises  Exercises (handout provided)  Total Joint Exercises  Standing Hip Extension AROM;10 reps;Left;Standing  General Exercises - Lower Extremity  Long Arc Quad AROM;Left;10 reps;Seated (hold for 3 sec)  Hip ABduction/ADduction Left;10 reps;Standing;AROM  Hip Flexion/Marching AROM;Left;10 reps;Standing  PT - End of Session  Equipment Utilized During Treatment Gait belt  Activity Tolerance Patient tolerated treatment well  Patient left in chair;with call bell in reach  Nurse Communication Mobility status for transfers  General  Behavior During Session  [facility] for tasks performed  Cognition [facility] for tasks performed  PT - Assessment/Plan  Follow Up Recommendations Home health PT  Equipment Recommended Rolling walker with 5" wheels  Acute Rehab PT Goals  PT Goal: Supine/Side to Sit - Progress Progressing toward goal  PT Goal: Sit to Supine/Side - Progress Progressing toward goal  PT Goal: Sit to Stand - Progress Progressing toward goal  PT Goal: Stand to Sit - Progress Progressing toward goal  PT Transfer Goal: Bed to Chair/Chair to Bed - Progress Progressing toward goal  PT Goal: Ambulate - Progress Progressing toward goal  PT Goal: Up/Down Stairs - Progress Progressing toward goal      Pain: 3/10 L LE pain  Lewis Shock, PT, DPT Pager #: [phone] Office #: [phone]

## 2011-11-13 NOTE — Progress Notes (Signed)
Pt D/Cd to home with family and scripts. Dressing supplies given.

## 2011-11-13 NOTE — Discharge Instructions (Signed)
You may shower on Day 5 and get incisions wet. Leave steri strips on skin. Change dressing as needed and when there is no longer any drainage feel free to go without a dressing. Take on aspirin a day for 3 weeks. You may put full weight on leg, walker for assistance.

## 2011-11-13 NOTE — Discharge Summary (Signed)
  PATIENT ID:      Carrie Adams  MRN:     [id number] DOB/AGE:    [birth year] / 56 y.o.     DISCHARGE SUMMARY  ADMISSION DATE:    11/10/2011 DISCHARGE DATE:   11/13/2011   ADMISSION DIAGNOSIS: Intertrochanteric fracture of left hip [820.21] hip fx   (Left intertroch hip fracture)  DISCHARGE DIAGNOSIS:  Left intertroch hip fracture    ADDITIONAL DIAGNOSIS: Active Problems:  * No active hospital problems. *   Past Medical History  Diagnosis Date  . Stroke     PROCEDURE: Procedure(s): INTRAMEDULLARY (IM) NAIL FEMORAL on 11/10/2011 - 11/11/2011  CONSULTS: Treatment Team:  Senaida Lange, MD   HISTORY:  See H&P in chart  HOSPITAL COURSE:  [name] is a 56 y.o. admitted on 11/10/2011 and found to have a diagnosis of Left intertroch hip fracture.  After appropriate laboratory studies were obtained  they were taken to the operating room on 11/10/2011 - 11/11/2011 and underwent Procedure(s): INTRAMEDULLARY (IM) NAIL FEMORAL.   They were given perioperative antibiotics:  Anti-infectives     Start     Dose/Rate Route Frequency Ordered Stop   11/11/11 0600   ceFAZolin (ANCEF) IVPB 1 g/50 mL premix        1 g 100 mL/hr over 30 Minutes Intravenous Every 6 hours 11/11/11 0143 11/11/11 1825        . Blood products given:none  Pt did extremely well with surgery and remained hemodynamically stable post operatively. She was tolerating po analgesics . The remainder of the hospital course was dedicated to ambulation and strengthening.   The patient was discharged on 3 Days Post-Op in  Good condition.

## 2011-11-13 NOTE — Progress Notes (Signed)
CARE MANAGEMENT NOTE 11/13/2011    Subjective/Objective Assessment:   56 yr old female s/p left IM Nail     Action/Plan:   Discharge planning.   Anticipated DC Date:  11/13/2011   Anticipated DC Plan:  HOME/SELF CARE      DC Planning Services  CM consult      PAC Choice  DURABLE MEDICAL EQUIPMENT   Choice offered to / List presented to:  NA   DME arranged  [facility] - Lavone Nian      DME agency  [facility]     HH arranged  NA      HH agency  NA   Status of service:  Completed, signed off  Discharge Disposition:  HOME/SELF CARE

## 2011-11-14 ENCOUNTER — Encounter (HOSPITAL_COMMUNITY): Payer: Self-pay | Admitting: Orthopedic Surgery

## 2012-02-26 DIAGNOSIS — I359 Nonrheumatic aortic valve disorder, unspecified: Secondary | ICD-10-CM

## 2015-06-04 DIAGNOSIS — Z23 Encounter for immunization: Secondary | ICD-10-CM | POA: Diagnosis not present

## 2015-07-01 DIAGNOSIS — G2581 Restless legs syndrome: Secondary | ICD-10-CM | POA: Diagnosis not present

## 2015-07-01 DIAGNOSIS — G4720 Circadian rhythm sleep disorder, unspecified type: Secondary | ICD-10-CM | POA: Diagnosis not present

## 2015-07-21 DIAGNOSIS — E038 Other specified hypothyroidism: Secondary | ICD-10-CM | POA: Diagnosis not present

## 2015-07-21 DIAGNOSIS — E782 Mixed hyperlipidemia: Secondary | ICD-10-CM | POA: Diagnosis not present

## 2015-07-21 DIAGNOSIS — Z79899 Other long term (current) drug therapy: Secondary | ICD-10-CM | POA: Diagnosis not present

## 2015-07-21 DIAGNOSIS — M797 Fibromyalgia: Secondary | ICD-10-CM | POA: Diagnosis not present

## 2015-07-21 DIAGNOSIS — E784 Other hyperlipidemia: Secondary | ICD-10-CM | POA: Diagnosis not present

## 2015-07-21 DIAGNOSIS — Z6822 Body mass index (BMI) 22.0-22.9, adult: Secondary | ICD-10-CM | POA: Diagnosis not present

## 2015-10-08 DIAGNOSIS — M797 Fibromyalgia: Secondary | ICD-10-CM | POA: Diagnosis not present

## 2015-10-08 DIAGNOSIS — Z1389 Encounter for screening for other disorder: Secondary | ICD-10-CM | POA: Diagnosis not present

## 2015-10-08 DIAGNOSIS — E038 Other specified hypothyroidism: Secondary | ICD-10-CM | POA: Diagnosis not present

## 2015-10-08 DIAGNOSIS — E784 Other hyperlipidemia: Secondary | ICD-10-CM | POA: Diagnosis not present

## 2015-10-08 DIAGNOSIS — Z0000 Encounter for general adult medical examination without abnormal findings: Secondary | ICD-10-CM | POA: Diagnosis not present

## 2015-10-08 DIAGNOSIS — Z6822 Body mass index (BMI) 22.0-22.9, adult: Secondary | ICD-10-CM | POA: Diagnosis not present

## 2015-10-31 DIAGNOSIS — G629 Polyneuropathy, unspecified: Secondary | ICD-10-CM | POA: Diagnosis not present

## 2015-10-31 DIAGNOSIS — E039 Hypothyroidism, unspecified: Secondary | ICD-10-CM | POA: Diagnosis not present

## 2015-10-31 DIAGNOSIS — Z7982 Long term (current) use of aspirin: Secondary | ICD-10-CM | POA: Diagnosis not present

## 2015-10-31 DIAGNOSIS — Z8673 Personal history of transient ischemic attack (TIA), and cerebral infarction without residual deficits: Secondary | ICD-10-CM | POA: Diagnosis not present

## 2015-10-31 DIAGNOSIS — I8002 Phlebitis and thrombophlebitis of superficial vessels of left lower extremity: Secondary | ICD-10-CM | POA: Diagnosis not present

## 2015-10-31 DIAGNOSIS — R69 Illness, unspecified: Secondary | ICD-10-CM | POA: Diagnosis not present

## 2015-10-31 DIAGNOSIS — Z79899 Other long term (current) drug therapy: Secondary | ICD-10-CM | POA: Diagnosis not present

## 2015-10-31 DIAGNOSIS — F17200 Nicotine dependence, unspecified, uncomplicated: Secondary | ICD-10-CM | POA: Diagnosis not present

## 2015-11-03 DIAGNOSIS — R7889 Finding of other specified substances, not normally found in blood: Secondary | ICD-10-CM | POA: Diagnosis not present

## 2015-11-03 DIAGNOSIS — M79605 Pain in left leg: Secondary | ICD-10-CM | POA: Diagnosis not present

## 2015-11-05 DIAGNOSIS — G2581 Restless legs syndrome: Secondary | ICD-10-CM | POA: Diagnosis not present

## 2016-01-04 DIAGNOSIS — I714 Abdominal aortic aneurysm, without rupture: Secondary | ICD-10-CM | POA: Diagnosis not present

## 2016-01-11 DIAGNOSIS — E038 Other specified hypothyroidism: Secondary | ICD-10-CM | POA: Diagnosis not present

## 2016-01-11 DIAGNOSIS — M797 Fibromyalgia: Secondary | ICD-10-CM | POA: Diagnosis not present

## 2016-01-11 DIAGNOSIS — E784 Other hyperlipidemia: Secondary | ICD-10-CM | POA: Diagnosis not present

## 2016-01-11 DIAGNOSIS — Z6821 Body mass index (BMI) 21.0-21.9, adult: Secondary | ICD-10-CM | POA: Diagnosis not present

## 2016-01-25 DIAGNOSIS — H3562 Retinal hemorrhage, left eye: Secondary | ICD-10-CM | POA: Diagnosis not present

## 2016-02-04 DIAGNOSIS — Z7689 Persons encountering health services in other specified circumstances: Secondary | ICD-10-CM | POA: Diagnosis not present

## 2016-02-04 DIAGNOSIS — Z131 Encounter for screening for diabetes mellitus: Secondary | ICD-10-CM | POA: Diagnosis not present

## 2016-02-10 DIAGNOSIS — E784 Other hyperlipidemia: Secondary | ICD-10-CM | POA: Diagnosis not present

## 2016-02-10 DIAGNOSIS — M797 Fibromyalgia: Secondary | ICD-10-CM | POA: Diagnosis not present

## 2016-02-10 DIAGNOSIS — E038 Other specified hypothyroidism: Secondary | ICD-10-CM | POA: Diagnosis not present

## 2016-02-17 DIAGNOSIS — M25552 Pain in left hip: Secondary | ICD-10-CM | POA: Diagnosis not present

## 2016-02-17 DIAGNOSIS — M25562 Pain in left knee: Secondary | ICD-10-CM | POA: Diagnosis not present

## 2016-02-17 DIAGNOSIS — Z967 Presence of other bone and tendon implants: Secondary | ICD-10-CM | POA: Diagnosis not present

## 2016-02-17 DIAGNOSIS — Z8781 Personal history of (healed) traumatic fracture: Secondary | ICD-10-CM | POA: Diagnosis not present

## 2016-02-17 DIAGNOSIS — T85848A Pain due to other internal prosthetic devices, implants and grafts, initial encounter: Secondary | ICD-10-CM | POA: Diagnosis not present

## 2016-03-07 DIAGNOSIS — Z6821 Body mass index (BMI) 21.0-21.9, adult: Secondary | ICD-10-CM | POA: Diagnosis not present

## 2016-03-07 DIAGNOSIS — M25552 Pain in left hip: Secondary | ICD-10-CM | POA: Diagnosis not present

## 2016-03-16 DIAGNOSIS — E038 Other specified hypothyroidism: Secondary | ICD-10-CM | POA: Diagnosis not present

## 2016-03-16 DIAGNOSIS — E784 Other hyperlipidemia: Secondary | ICD-10-CM | POA: Diagnosis not present

## 2016-03-17 ENCOUNTER — Ambulatory Visit: Payer: Self-pay | Admitting: Orthopedic Surgery

## 2016-03-29 ENCOUNTER — Ambulatory Visit: Payer: Self-pay | Admitting: Orthopedic Surgery

## 2016-03-29 NOTE — H&P (Signed)
PREOPERATIVE H&P  Chief Complaint: left femur hardware removal  HPI: Carrie Adams is a 60 y.o. female who presents for preoperative history and physical with a diagnosis of symptomatic retained hardware, left hip. Symptoms are rated as moderate to severe, and have been worsening.  This is significantly impairing activities of daily living.  She has elected for surgical management.   Past Medical History:  Diagnosis Date  . Stroke    Past Surgical History:  Procedure Laterality Date  . ABDOMINAL HYSTERECTOMY    . APPENDECTOMY    . COSMETIC SURGERY    . FEMUR IM NAIL  11/10/2011   Procedure: INTRAMEDULLARY (IM) NAIL FEMORAL;  Surgeon: [name], MD;  Location: [place];  Service: Orthopedics;  Laterality: Left;   Social History   Social History  . Marital status: Divorced    Spouse name: N/A  . Number of children: N/A  . Years of education: N/A   Social History Main Topics  . Smoking status: Current Every Day Smoker  . Smokeless tobacco: Not on file  . Alcohol use No  . Drug use: No  . Sexual activity: Not on file   Other Topics Concern  . Not on file   Social History Narrative  . No narrative on file   No family history on file. Allergies  Allergen Reactions  . Amoxicillin Nausea And Vomiting   Prior to Admission medications   Medication Sig Start Date End Date Taking? Authorizing Provider  Doxylamine-DM (NIGHT-TIME COUGH) 6.25-15 MG/15ML LIQD Take 5 mLs by mouth daily as needed. For cough flu symptoms    Historical Provider, MD  guaiFENesin (MUCINEX) 600 MG 12 hr tablet Take 600 mg by mouth daily as needed. For allergies per patient    Historical Provider, MD  Melatonin 3 MG TABS Take 1 tablet by mouth at bedtime.    Historical Provider, MD     Positive ROS: All other systems have been reviewed and were otherwise negative with the exception of those mentioned in the HPI and as above.  Physical Exam: General: Alert, no acute distress Cardiovascular: No pedal  edema Respiratory: No cyanosis, no use of accessory musculature GI: No organomegaly, abdomen is soft and non-tender Skin: No lesions in the area of chief complaint Neurologic: Sensation intact distally Psychiatric: Patient is competent for consent with normal mood and affect Lymphatic: No axillary or cervical lymphadenopathy  MUSCULOSKELETAL: L hip: healed incisions. TTP over hardware and IT band. NVI.  Assessment: symptomatic retained hardware, left hip  Plan: Plan for left femur hardware removal  The risks benefits and alternatives were discussed with the patient including but not limited to the risks of nonoperative treatment, versus surgical intervention including infection, bleeding, nerve injury,  blood clots, cardiopulmonary complications, morbidity, mortality, among others, and they were willing to proceed. She understands that we will need to protect her weight bearing for 6 weeks postop.  Oris Calmes, Horald Pollen, MD Cell [phone]   03/29/2016 12:10 PM

## 2016-03-31 DIAGNOSIS — E038 Other specified hypothyroidism: Secondary | ICD-10-CM | POA: Diagnosis not present

## 2016-03-31 DIAGNOSIS — E784 Other hyperlipidemia: Secondary | ICD-10-CM | POA: Diagnosis not present

## 2016-03-31 NOTE — Patient Instructions (Addendum)
ANJU SERENO  03/31/2016   Your procedure is scheduled on: 04-06-16  Report to [facility]  Entrance take [facility]  elevators to 3rd floor to  [place] at 1045  AM.  Call this number if you have problems the morning of surgery [phone]   Remember: ONLY 1 PERSON MAY GO WITH YOU TO SHORT STAY TO GET  READY MORNING OF [place].  Do not eat food or drink liquids :After Midnight.     Take these medicines the morning of surgery with A SIP OF WATER: gabapentin (neurontin), duloxetin (cymbalta), levothyroxine                               You may not have any metal on your body including hair pins and              piercings  Do not wear jewelry, make-up, lotions, powders or perfumes, deodorant             Do not wear nail polish.  Do not shave  48 hours prior to surgery.              Men may shave face and neck.   Do not bring valuables to the hospital. [place].  Contacts, dentures or bridgework may not be worn into surgery.  Leave suitcase in the car. After surgery it may be brought to your room.                  Please read over the following fact sheets you were given: _____________________________________________________________________             [facility] - Preparing for Surgery Before surgery, you can play an important role.  Because skin is not sterile, your skin needs to be as free of germs as possible.  You can reduce the number of germs on your skin by washing with CHG (chlorahexidine gluconate) soap before surgery.  CHG is an antiseptic cleaner which kills germs and bonds with the skin to continue killing germs even after washing. Please DO NOT use if you have an allergy to CHG or antibacterial soaps.  If your skin becomes reddened/irritated stop using the CHG and inform your nurse when you arrive at Short Stay. Do not shave (including legs and underarms) for at least 48 hours prior  to the first CHG shower.  You may shave your face/neck. Please follow these instructions carefully:  1.  Shower with CHG Soap the night before surgery and the  morning of Surgery.  2.  If you choose to wash your hair, wash your hair first as usual with your  normal  shampoo.  3.  After you shampoo, rinse your hair and body thoroughly to remove the  shampoo.                           4.  Use CHG as you would any other liquid soap.  You can apply chg directly  to the skin and wash                       Gently with a scrungie or clean washcloth.  5.  Apply the CHG Soap to your body ONLY FROM THE NECK DOWN.   Do not use on face/ open                           Wound or open sores. Avoid contact with eyes, ears mouth and genitals (private parts).                       Wash face,  Genitals (private parts) with your normal soap.             6.  Wash thoroughly, paying special attention to the area where your surgery  will be performed.  7.  Thoroughly rinse your body with warm water from the neck down.  8.  DO NOT shower/wash with your normal soap after using and rinsing off  the CHG Soap.                9.  Pat yourself dry with a clean towel.            10.  Wear clean pajamas.            11.  Place clean sheets on your bed the night of your first shower and do not  sleep with pets. Day of Surgery : Do not apply any lotions/deodorants the morning of surgery.  Please wear clean clothes to the hospital/surgery center.  FAILURE TO FOLLOW THESE INSTRUCTIONS MAY RESULT IN THE CANCELLATION OF YOUR SURGERY PATIENT SIGNATURE_________________________________  NURSE SIGNATURE__________________________________  ________________________________________________________________________   Adam Phenix  An incentive spirometer is a tool that can help keep your lungs clear and active. This tool measures how well you are filling your lungs with each breath. Taking long deep breaths may help reverse or  decrease the chance of developing breathing (pulmonary) problems (especially infection) following:  A long period of time when you are unable to move or be active. BEFORE THE PROCEDURE   If the spirometer includes an indicator to show your best effort, your nurse or respiratory therapist will set it to a desired goal.  If possible, sit up straight or lean slightly forward. Try not to slouch.  Hold the incentive spirometer in an upright position. INSTRUCTIONS FOR USE  1. Sit on the edge of your bed if possible, or sit up as far as you can in bed or on a chair. 2. Hold the incentive spirometer in an upright position. 3. Breathe out normally. 4. Place the mouthpiece in your mouth and seal your lips tightly around it. 5. Breathe in slowly and as deeply as possible, raising the piston or the ball toward the top of the column. 6. Hold your breath for 3-5 seconds or for as long as possible. Allow the piston or ball to fall to the bottom of the column. 7. Remove the mouthpiece from your mouth and breathe out normally. 8. Rest for a few seconds and repeat Steps 1 through 7 at least 10 times every 1-2 hours when you are awake. Take your time and take a few normal breaths between deep breaths. 9. The spirometer may include an indicator to show your best effort. Use the indicator as a goal to work toward during each repetition. 10. After each set of 10 deep breaths, practice coughing to be sure your lungs are clear. If you have an incision (the cut made at the time of surgery), support your incision when coughing by placing a pillow or  rolled up towels firmly against it. Once you are able to get out of bed, walk around indoors and cough well. You may stop using the incentive spirometer when instructed by your caregiver.  RISKS AND COMPLICATIONS  Take your time so you do not get dizzy or light-headed.  If you are in pain, you may need to take or ask for pain medication before doing incentive spirometry.  It is harder to take a deep breath if you are having pain. AFTER USE  Rest and breathe slowly and easily.  It can be helpful to keep track of a log of your progress. Your caregiver can provide you with a simple table to help with this. If you are using the spirometer at home, follow these instructions: [place] IF:   You are having difficultly using the spirometer.  You have trouble using the spirometer as often as instructed.  Your pain medication is not giving enough relief while using the spirometer.  You develop fever of 100.5 F (38.1 C) or higher. SEEK IMMEDIATE MEDICAL CARE IF:   You cough up bloody sputum that had not been present before.  You develop fever of 102 F (38.9 C) or greater.  You develop worsening pain at or near the incision site. MAKE SURE YOU:   Understand these instructions.  Will watch your condition.  Will get help right away if you are not doing well or get worse. Document Released: 12/25/2006 Document Revised: 11/06/2011 Document Reviewed: 02/25/2007 [facility] Patient Information 2014 [place], [place].   ________________________________________________________________________

## 2016-04-03 ENCOUNTER — Encounter (HOSPITAL_COMMUNITY): Payer: Self-pay

## 2016-04-03 ENCOUNTER — Encounter (HOSPITAL_COMMUNITY)
Admission: RE | Admit: 2016-04-03 | Discharge: 2016-04-03 | Disposition: A | Discharge status: Home / Self Care | Payer: Medicare HMO | Source: Ambulatory Visit | Attending: Orthopedic Surgery | Admitting: Orthopedic Surgery

## 2016-04-03 DIAGNOSIS — Y831 Surgical operation with implant of artificial internal device as the cause of abnormal reaction of the patient, or of later complication, without mention of misadventure at the time of the procedure: Secondary | ICD-10-CM | POA: Diagnosis not present

## 2016-04-03 DIAGNOSIS — F17200 Nicotine dependence, unspecified, uncomplicated: Secondary | ICD-10-CM | POA: Diagnosis not present

## 2016-04-03 DIAGNOSIS — T8484XA Pain due to internal orthopedic prosthetic devices, implants and grafts, initial encounter: Secondary | ICD-10-CM | POA: Diagnosis not present

## 2016-04-03 DIAGNOSIS — R69 Illness, unspecified: Secondary | ICD-10-CM | POA: Diagnosis not present

## 2016-04-03 DIAGNOSIS — Z8673 Personal history of transient ischemic attack (TIA), and cerebral infarction without residual deficits: Secondary | ICD-10-CM | POA: Diagnosis not present

## 2016-04-03 DIAGNOSIS — M1990 Unspecified osteoarthritis, unspecified site: Secondary | ICD-10-CM | POA: Diagnosis not present

## 2016-04-03 HISTORY — DX: Acute embolism and thrombosis of unspecified vein: I82.90

## 2016-04-03 HISTORY — DX: Hypothyroidism, unspecified: E03.9

## 2016-04-03 HISTORY — DX: Unspecified osteoarthritis, unspecified site: M19.90

## 2016-04-03 HISTORY — DX: Anxiety disorder, unspecified: F41.9

## 2016-04-03 LAB — BASIC METABOLIC PANEL
Anion gap: 7 (ref 5–15)
BUN: 11 mg/dL (ref 6–20)
CO2: 28 mmol/L (ref 22–32)
Calcium: 9.4 mg/dL (ref 8.9–10.3)
Chloride: 103 mmol/L (ref 101–111)
Creatinine, Ser: 0.64 mg/dL (ref 0.44–1.00)
GFR calc Af Amer: >60 mL/min (ref >60)
GFR calc non Af Amer: >60 mL/min (ref >60)
Glucose, Bld: 95 mg/dL (ref 65–99)
Potassium: 4.2 mmol/L (ref 3.5–5.1)
Sodium: 138 mmol/L (ref 135–145)

## 2016-04-03 LAB — CBC
HCT: 47.6 % — ABNORMAL HIGH (ref 36.0–46.0)
Hemoglobin: 15.6 g/dL — ABNORMAL HIGH (ref 12.0–15.0)
MCH: 30.7 pg (ref 26.0–34.0)
MCHC: 32.8 g/dL (ref 30.0–36.0)
MCV: 93.7 fL (ref 78.0–100.0)
Platelets: 216 10*3/uL (ref 150–400)
RBC: 5.08 MIL/uL (ref 3.87–5.11)
RDW: 13.4 % (ref 11.5–15.5)
WBC: 6.7 10*3/uL (ref 4.0–10.5)

## 2016-04-06 ENCOUNTER — Ambulatory Visit (HOSPITAL_COMMUNITY): Payer: Medicare HMO | Admitting: Anesthesiology

## 2016-04-06 ENCOUNTER — Encounter (HOSPITAL_COMMUNITY)
Admission: RE | Disposition: A | Discharge status: Home / Self Care | Payer: Self-pay | Source: Ambulatory Visit | Attending: Orthopedic Surgery

## 2016-04-06 ENCOUNTER — Ambulatory Visit (HOSPITAL_COMMUNITY): Payer: Medicare HMO

## 2016-04-06 ENCOUNTER — Observation Stay (HOSPITAL_COMMUNITY): Payer: Medicare HMO

## 2016-04-06 ENCOUNTER — Observation Stay (HOSPITAL_COMMUNITY)
Admission: RE | Admit: 2016-04-06 | Discharge: 2016-04-07 | Disposition: A | Discharge status: Home / Self Care | Payer: Medicare HMO | Source: Ambulatory Visit | Attending: Orthopedic Surgery | Admitting: Orthopedic Surgery

## 2016-04-06 ENCOUNTER — Encounter (HOSPITAL_COMMUNITY): Payer: Self-pay

## 2016-04-06 DIAGNOSIS — T8484XA Pain due to internal orthopedic prosthetic devices, implants and grafts, initial encounter: Secondary | ICD-10-CM | POA: Diagnosis not present

## 2016-04-06 DIAGNOSIS — T84498A Other mechanical complication of other internal orthopedic devices, implants and grafts, initial encounter: Secondary | ICD-10-CM | POA: Diagnosis not present

## 2016-04-06 DIAGNOSIS — Y831 Surgical operation with implant of artificial internal device as the cause of abnormal reaction of the patient, or of later complication, without mention of misadventure at the time of the procedure: Secondary | ICD-10-CM | POA: Insufficient documentation

## 2016-04-06 DIAGNOSIS — F17200 Nicotine dependence, unspecified, uncomplicated: Secondary | ICD-10-CM | POA: Diagnosis not present

## 2016-04-06 DIAGNOSIS — R69 Illness, unspecified: Secondary | ICD-10-CM | POA: Diagnosis not present

## 2016-04-06 DIAGNOSIS — Z9889 Other specified postprocedural states: Secondary | ICD-10-CM

## 2016-04-06 DIAGNOSIS — Z8673 Personal history of transient ischemic attack (TIA), and cerebral infarction without residual deficits: Secondary | ICD-10-CM | POA: Diagnosis not present

## 2016-04-06 DIAGNOSIS — Z09 Encounter for follow-up examination after completed treatment for conditions other than malignant neoplasm: Secondary | ICD-10-CM

## 2016-04-06 DIAGNOSIS — M1990 Unspecified osteoarthritis, unspecified site: Secondary | ICD-10-CM | POA: Insufficient documentation

## 2016-04-06 DIAGNOSIS — Z472 Encounter for removal of internal fixation device: Secondary | ICD-10-CM | POA: Diagnosis not present

## 2016-04-06 HISTORY — PX: HARDWARE REMOVAL: SHX979

## 2016-04-06 SURGERY — REMOVAL, HARDWARE
Anesthesia: General | Site: Hip | Laterality: Left

## 2016-04-06 MED ORDER — DULOXETINE HCL 60 MG PO CPEP
60.0000 mg | ORAL_CAPSULE | Freq: Two times a day (BID) | ORAL | Status: DC
  Administered 2016-04-07: 60 mg via ORAL
  Filled 2016-04-06 (×2): qty 2
  Filled 2016-04-06 (×2): qty 1

## 2016-04-06 MED ORDER — CEFAZOLIN SODIUM-DEXTROSE 2-4 GM/100ML-% IV SOLN
2.0000 g | INTRAVENOUS | Status: AC
  Administered 2016-04-06: 2 g via INTRAVENOUS

## 2016-04-06 MED ORDER — KETOROLAC TROMETHAMINE 15 MG/ML IJ SOLN
15.0000 mg | Freq: Four times a day (QID) | INTRAMUSCULAR | Status: DC
  Administered 2016-04-06 – 2016-04-07 (×3): 15 mg via INTRAVENOUS
  Filled 2016-04-06 (×3): qty 1

## 2016-04-06 MED ORDER — FENTANYL CITRATE (PF) 100 MCG/2ML IJ SOLN
INTRAMUSCULAR | Status: DC | PRN
  Administered 2016-04-06: 100 ug via INTRAVENOUS

## 2016-04-06 MED ORDER — ONDANSETRON HCL 4 MG/2ML IJ SOLN
INTRAMUSCULAR | Status: DC | PRN
  Administered 2016-04-06: 4 mg via INTRAVENOUS

## 2016-04-06 MED ORDER — ISOPROPYL ALCOHOL 70 % SOLN
Status: AC
  Filled 2016-04-06: qty 480

## 2016-04-06 MED ORDER — CEFAZOLIN IN D5W 1 GM/50ML IV SOLN
1.0000 g | Freq: Four times a day (QID) | INTRAVENOUS | Status: AC
  Administered 2016-04-06 – 2016-04-07 (×3): 1 g via INTRAVENOUS
  Filled 2016-04-06 (×3): qty 50

## 2016-04-06 MED ORDER — DOCUSATE SODIUM 100 MG PO CAPS
100.0000 mg | ORAL_CAPSULE | Freq: Two times a day (BID) | ORAL | Status: DC
  Administered 2016-04-07: 100 mg via ORAL
  Filled 2016-04-06 (×5): qty 1

## 2016-04-06 MED ORDER — METOCLOPRAMIDE HCL 5 MG PO TABS
5.0000 mg | ORAL_TABLET | Freq: Three times a day (TID) | ORAL | Status: DC | PRN
  Filled 2016-04-06: qty 2

## 2016-04-06 MED ORDER — PROMETHAZINE HCL 25 MG/ML IJ SOLN: 6.2500 mg | INTRAMUSCULAR | Status: DC | PRN

## 2016-04-06 MED ORDER — LACTATED RINGERS IV SOLN
INTRAVENOUS | Status: DC
  Administered 2016-04-06 (×2): via INTRAVENOUS

## 2016-04-06 MED ORDER — ONDANSETRON HCL 4 MG/2ML IJ SOLN
INTRAMUSCULAR | Status: AC
Start: 2016-04-06 — End: 2016-04-06
  Filled 2016-04-06: qty 2

## 2016-04-06 MED ORDER — MIDAZOLAM HCL 5 MG/5ML IJ SOLN
INTRAMUSCULAR | Status: DC | PRN
  Administered 2016-04-06: 2 mg via INTRAVENOUS

## 2016-04-06 MED ORDER — LIDOCAINE HCL (CARDIAC) 20 MG/ML IV SOLN
INTRAVENOUS | Status: AC
  Filled 2016-04-06: qty 5

## 2016-04-06 MED ORDER — MIDAZOLAM HCL 2 MG/2ML IJ SOLN
INTRAMUSCULAR | Status: AC
  Filled 2016-04-06: qty 2

## 2016-04-06 MED ORDER — ONDANSETRON HCL 4 MG PO TABS
4.0000 mg | ORAL_TABLET | Freq: Four times a day (QID) | ORAL | Status: DC | PRN
  Filled 2016-04-06: qty 1

## 2016-04-06 MED ORDER — HYDROMORPHONE HCL 1 MG/ML IJ SOLN
INTRAMUSCULAR | Status: AC
Start: 2016-04-06 — End: 2016-04-07
  Filled 2016-04-06: qty 1

## 2016-04-06 MED ORDER — LEVOTHYROXINE SODIUM 75 MCG PO TABS
75.0000 ug | ORAL_TABLET | Freq: Every day | ORAL | Status: DC
  Administered 2016-04-07: 75 ug via ORAL
  Filled 2016-04-06: qty 1

## 2016-04-06 MED ORDER — ASPIRIN 81 MG PO CHEW
81.0000 mg | CHEWABLE_TABLET | Freq: Two times a day (BID) | ORAL | Status: DC
  Administered 2016-04-06 – 2016-04-07 (×2): 81 mg via ORAL
  Filled 2016-04-06 (×5): qty 1

## 2016-04-06 MED ORDER — GLYCOPYRROLATE 0.2 MG/ML IJ SOLN
INTRAMUSCULAR | Status: AC
  Filled 2016-04-06: qty 1

## 2016-04-06 MED ORDER — METOCLOPRAMIDE HCL 5 MG/ML IJ SOLN: 5.0000 mg | Freq: Three times a day (TID) | INTRAMUSCULAR | Status: DC | PRN

## 2016-04-06 MED ORDER — HYDROMORPHONE HCL 1 MG/ML IJ SOLN
0.2500 mg | INTRAMUSCULAR | Status: DC | PRN
  Administered 2016-04-06: 0.5 mg via INTRAVENOUS

## 2016-04-06 MED ORDER — PROPOFOL 10 MG/ML IV BOLUS
INTRAVENOUS | Status: DC | PRN
  Administered 2016-04-06: 100 mg via INTRAVENOUS

## 2016-04-06 MED ORDER — SENNA 8.6 MG PO TABS
1.0000 | ORAL_TABLET | Freq: Two times a day (BID) | ORAL | Status: DC
  Administered 2016-04-07: 8.6 mg via ORAL
  Filled 2016-04-06 (×5): qty 1

## 2016-04-06 MED ORDER — FENTANYL CITRATE (PF) 250 MCG/5ML IJ SOLN
INTRAMUSCULAR | Status: AC
  Filled 2016-04-06: qty 5

## 2016-04-06 MED ORDER — EPHEDRINE SULFATE 50 MG/ML IJ SOLN
INTRAMUSCULAR | Status: DC | PRN
  Administered 2016-04-06 (×2): 10 mg via INTRAVENOUS

## 2016-04-06 MED ORDER — CHLORHEXIDINE GLUCONATE 4 % EX LIQD
60.0000 mL | Freq: Once | CUTANEOUS | Status: AC
  Administered 2016-04-06: 4 via TOPICAL

## 2016-04-06 MED ORDER — HYDROMORPHONE HCL 1 MG/ML IJ SOLN: 0.5000 mg | INTRAMUSCULAR | Status: DC | PRN

## 2016-04-06 MED ORDER — GABAPENTIN 300 MG PO CAPS
600.0000 mg | ORAL_CAPSULE | Freq: Three times a day (TID) | ORAL | Status: DC
  Administered 2016-04-06 – 2016-04-07 (×3): 600 mg via ORAL
  Filled 2016-04-06 (×3): qty 2

## 2016-04-06 MED ORDER — ONDANSETRON HCL 4 MG/2ML IJ SOLN: 4.0000 mg | Freq: Four times a day (QID) | INTRAMUSCULAR | Status: DC | PRN

## 2016-04-06 MED ORDER — CEFAZOLIN SODIUM-DEXTROSE 2-4 GM/100ML-% IV SOLN
INTRAVENOUS | Status: AC
  Filled 2016-04-06: qty 100

## 2016-04-06 MED ORDER — CYANOCOBALAMIN 500 MCG PO TABS
500.0000 ug | ORAL_TABLET | Freq: Every day | ORAL | Status: DC
  Filled 2016-04-06: qty 1

## 2016-04-06 MED ORDER — VITAMIN C 500 MG PO TABS
500.0000 mg | ORAL_TABLET | Freq: Every day | ORAL | Status: DC
  Filled 2016-04-06: qty 1

## 2016-04-06 MED ORDER — CALCIUM CARBONATE 1250 (500 CA) MG PO TABS
1.0000 | ORAL_TABLET | Freq: Every day | ORAL | Status: DC
  Filled 2016-04-06 (×2): qty 1

## 2016-04-06 MED ORDER — CLONAZEPAM 1 MG PO TABS
1.0000 mg | ORAL_TABLET | Freq: Every day | ORAL | Status: DC
  Administered 2016-04-06: 1 mg via ORAL
  Filled 2016-04-06: qty 1

## 2016-04-06 MED ORDER — SUGAMMADEX SODIUM 500 MG/5ML IV SOLN
INTRAVENOUS | Status: DC | PRN
  Administered 2016-04-06: 200 mg via INTRAVENOUS

## 2016-04-06 MED ORDER — ACETAMINOPHEN 650 MG RE SUPP
650.0000 mg | Freq: Four times a day (QID) | RECTAL | Status: DC | PRN
  Filled 2016-04-06: qty 1

## 2016-04-06 MED ORDER — VITAMIN D3 25 MCG (1000 UNIT) PO TABS
1000.0000 [IU] | ORAL_TABLET | Freq: Every day | ORAL | Status: DC
  Filled 2016-04-06 (×2): qty 1

## 2016-04-06 MED ORDER — ROCURONIUM BROMIDE 100 MG/10ML IV SOLN
INTRAVENOUS | Status: DC | PRN
  Administered 2016-04-06: 40 mg via INTRAVENOUS

## 2016-04-06 MED ORDER — SODIUM CHLORIDE 0.9 % IV SOLN
INTRAVENOUS | Status: DC
  Administered 2016-04-06 – 2016-04-07 (×2): via INTRAVENOUS

## 2016-04-06 MED ORDER — SUGAMMADEX SODIUM 200 MG/2ML IV SOLN
INTRAVENOUS | Status: AC
  Filled 2016-04-06: qty 4

## 2016-04-06 MED ORDER — PROPOFOL 10 MG/ML IV BOLUS
INTRAVENOUS | Status: AC
  Filled 2016-04-06: qty 20

## 2016-04-06 MED ORDER — GLYCOPYRROLATE 0.2 MG/ML IJ SOLN
INTRAMUSCULAR | Status: DC | PRN
  Administered 2016-04-06: 0.2 mg via INTRAVENOUS

## 2016-04-06 MED ORDER — HYDROCODONE-ACETAMINOPHEN 5-325 MG PO TABS
1.0000 | ORAL_TABLET | ORAL | Status: DC | PRN
  Filled 2016-04-06: qty 1

## 2016-04-06 MED ORDER — ONDANSETRON HCL 4 MG/2ML IJ SOLN
INTRAMUSCULAR | Status: AC
  Filled 2016-04-06: qty 2

## 2016-04-06 MED ORDER — HYDROMORPHONE HCL 1 MG/ML IJ SOLN
0.2500 mg | INTRAMUSCULAR | Status: DC | PRN
  Administered 2016-04-06 (×2): 0.5 mg via INTRAVENOUS

## 2016-04-06 MED ORDER — HYDROMORPHONE HCL 1 MG/ML IJ SOLN
INTRAMUSCULAR | Status: AC
  Filled 2016-04-06: qty 1

## 2016-04-06 MED ORDER — ACETAMINOPHEN 325 MG PO TABS: 650.0000 mg | ORAL_TABLET | Freq: Four times a day (QID) | ORAL | Status: DC | PRN

## 2016-04-06 MED ORDER — LIDOCAINE HCL (CARDIAC) 20 MG/ML IV SOLN
INTRAVENOUS | Status: DC | PRN
  Administered 2016-04-06: 50 mg via INTRAVENOUS

## 2016-04-06 MED ORDER — PRAVASTATIN SODIUM 20 MG PO TABS
20.0000 mg | ORAL_TABLET | Freq: Every day | ORAL | Status: DC
  Administered 2016-04-06: 20 mg via ORAL
  Filled 2016-04-06: qty 1

## 2016-04-06 MED ORDER — METHOCARBAMOL 500 MG PO TABS: 500.0000 mg | ORAL_TABLET | Freq: Four times a day (QID) | ORAL | Status: DC | PRN

## 2016-04-06 MED ORDER — METHOCARBAMOL 1000 MG/10ML IJ SOLN
500.0000 mg | Freq: Four times a day (QID) | INTRAMUSCULAR | Status: DC | PRN
  Administered 2016-04-06: 500 mg via INTRAVENOUS
  Filled 2016-04-06: qty 550
  Filled 2016-04-06: qty 5

## 2016-04-06 SURGICAL SUPPLY — 56 items
BAG SPEC THK2 15X12 ZIP CLS (MISCELLANEOUS) ×1
BAG ZIPLOCK 12X15 (MISCELLANEOUS) ×2 IMPLANT
BANDAGE ACE 6X5 VEL STRL LF (GAUZE/BANDAGES/DRESSINGS) ×2 IMPLANT
BANDAGE ESMARK 6X9 LF (GAUZE/BANDAGES/DRESSINGS) ×1 IMPLANT
BLADE 15 SAFETY STRL DISP (BLADE) ×1 IMPLANT
BNDG CMPR 9X6 STRL LF SNTH (GAUZE/BANDAGES/DRESSINGS)
BNDG ESMARK 6X9 LF (GAUZE/BANDAGES/DRESSINGS)
COVER MAYO STAND STRL (DRAPES) IMPLANT
CUFF TOURN SGL QUICK 34 (TOURNIQUET CUFF)
CUFF TRNQT CYL 34X4X40X1 (TOURNIQUET CUFF) ×1 IMPLANT
DRAPE C-ARM 42X120 X-RAY (DRAPES) ×1 IMPLANT
DRAPE C-ARMOR (DRAPES) ×1 IMPLANT
DRAPE EXTREMITY T 121X128X90 (DRAPE) IMPLANT
DRAPE OEC MINIVIEW 54X84 (DRAPES) IMPLANT
DRAPE ORTHO SPLIT 87X125 STRL (DRAPES) ×2 IMPLANT
DRAPE POUCH INSTRU U-SHP 10X18 (DRAPES) ×2 IMPLANT
DRAPE SHEET LG 3/4 BI-LAMINATE (DRAPES) ×2 IMPLANT
DRAPE STERI IOBAN 125X83 (DRAPES) IMPLANT
DRAPE U-SHAPE 47X51 STRL (DRAPES) ×2 IMPLANT
DRSG AQUACEL AG ADV 3.5X14 (GAUZE/BANDAGES/DRESSINGS) ×2 IMPLANT
DRSG EMULSION OIL 3X16 NADH (GAUZE/BANDAGES/DRESSINGS) ×2 IMPLANT
DRSG PAD ABDOMINAL 8X10 ST (GAUZE/BANDAGES/DRESSINGS) ×2 IMPLANT
DRSG TEGADERM 8X12 (GAUZE/BANDAGES/DRESSINGS) ×2 IMPLANT
DURAPREP 26ML APPLICATOR (WOUND CARE) ×2 IMPLANT
ELECT REM PT RETURN 9FT ADLT (ELECTROSURGICAL) ×2
ELECTRODE REM PT RTRN 9FT ADLT (ELECTROSURGICAL) ×1 IMPLANT
GAUZE SPONGE 4X4 12PLY STRL (GAUZE/BANDAGES/DRESSINGS) ×2 IMPLANT
GLOVE BIO SURGEON STRL SZ8.5 (GLOVE) ×2 IMPLANT
GLOVE BIOGEL PI IND STRL 7.5 (GLOVE) ×1 IMPLANT
GLOVE BIOGEL PI IND STRL 8 (GLOVE) IMPLANT
GLOVE BIOGEL PI IND STRL 8.5 (GLOVE) ×1 IMPLANT
GLOVE BIOGEL PI INDICATOR 7.5 (GLOVE)
GLOVE BIOGEL PI INDICATOR 8 (GLOVE) ×1
GLOVE BIOGEL PI INDICATOR 8.5 (GLOVE) ×1
GLOVE ECLIPSE 8.0 STRL XLNG CF (GLOVE) IMPLANT
GLOVE ORTHO TXT STRL SZ7.5 (GLOVE) ×2 IMPLANT
GLOVE SURG ORTHO 8.0 STRL STRW (GLOVE) ×1 IMPLANT
GOWN SPEC L3 XXLG W/TWL (GOWN DISPOSABLE) ×3 IMPLANT
GOWN STRL REUS W/TWL LRG LVL3 (GOWN DISPOSABLE) ×1 IMPLANT
GUIDEPIN 3.2X17.5 THRD DISP (PIN) ×2 IMPLANT
KIT BASIN OR (CUSTOM PROCEDURE TRAY) ×2 IMPLANT
LIQUID BAND (GAUZE/BANDAGES/DRESSINGS) ×1 IMPLANT
MANIFOLD NEPTUNE II (INSTRUMENTS) ×2 IMPLANT
NS IRRIG 1000ML POUR BTL (IV SOLUTION) ×2 IMPLANT
PACK TOTAL JOINT (CUSTOM PROCEDURE TRAY) ×2 IMPLANT
PADDING CAST COTTON 6X4 STRL (CAST SUPPLIES) ×2 IMPLANT
POSITIONER SURGICAL ARM (MISCELLANEOUS) ×2 IMPLANT
STAPLER VISISTAT 35W (STAPLE) ×1 IMPLANT
STRIP CLOSURE SKIN 1/2X4 (GAUZE/BANDAGES/DRESSINGS) IMPLANT
SUT MNCRL AB 4-0 PS2 18 (SUTURE) IMPLANT
SUT MON AB 2-0 CT1 36 (SUTURE) ×1 IMPLANT
SUT VIC AB 1 CT1 36 (SUTURE) ×6 IMPLANT
SUT VIC AB 2-0 CT1 27 (SUTURE) ×2
SUT VIC AB 2-0 CT1 TAPERPNT 27 (SUTURE) ×1 IMPLANT
TOWEL OR 17X26 10 PK STRL BLUE (TOWEL DISPOSABLE) ×2 IMPLANT
WATER STERILE IRR 1500ML POUR (IV SOLUTION) ×2 IMPLANT

## 2016-04-06 NOTE — Anesthesia Preprocedure Evaluation (Signed)
Anesthesia Evaluation  Patient identified by MRN, date of birth, ID band Patient awake    Reviewed: Allergy & Precautions, NPO status , Patient's Chart, lab work & pertinent test results  Airway Mallampati: II  TM Distance: >3 FB Neck ROM: Full    Dental no notable dental hx.    Pulmonary neg pulmonary ROS, Current Smoker,    Pulmonary exam normal breath sounds clear to auscultation       Cardiovascular negative cardio ROS Normal cardiovascular exam Rhythm:Regular Rate:Normal     Neuro/Psych Anxiety CVA    GI/Hepatic negative GI ROS, Neg liver ROS,   Endo/Other  Hypothyroidism   Renal/GU negative Renal ROS  negative genitourinary   Musculoskeletal negative musculoskeletal ROS (+)   Abdominal   Peds negative pediatric ROS (+)  Hematology negative hematology ROS (+)   Anesthesia Other Findings   Reproductive/Obstetrics negative OB ROS                             Anesthesia Physical Anesthesia Plan  ASA: III  Anesthesia Plan: General   Post-op Pain Management:    Induction: Intravenous  Airway Management Planned: Oral ETT  Additional Equipment:   Intra-op Plan:   Post-operative Plan: Extubation in OR  Informed Consent: I have reviewed the patients History and Physical, chart, labs and discussed the procedure including the risks, benefits and alternatives for the proposed anesthesia with the patient or authorized representative who has indicated his/her understanding and acceptance.   Dental advisory given  Plan Discussed with: CRNA and Surgeon  Anesthesia Plan Comments:         Anesthesia Quick Evaluation

## 2016-04-06 NOTE — Anesthesia Postprocedure Evaluation (Signed)
Anesthesia Post Note  Patient: Carrie Adams  Procedure(s) Performed: Procedure(s) (LRB): HARDWARE REMOVAL LEFT HIP (Left)  Patient location during evaluation: PACU Anesthesia Type: General Level of consciousness: awake and alert Pain management: pain level controlled Vital Signs Assessment: post-procedure vital signs reviewed and stable Respiratory status: spontaneous breathing, nonlabored ventilation, respiratory function stable and patient connected to nasal cannula oxygen Cardiovascular status: blood pressure returned to baseline and stable Postop Assessment: no signs of nausea or vomiting Anesthetic complications: no    Last Vitals:  Vitals:   04/06/16 1325 04/06/16 1330  BP: 133/61 131/67  Pulse: 75 72  Resp: 12 11  Temp: 36.5 C     Last Pain:  Vitals:   04/06/16 0858  TempSrc: Oral                 Leylani Duley S

## 2016-04-06 NOTE — Interval H&P Note (Signed)
History and Physical Interval Note:  04/06/2016 11:15 AM  Carrie Adams  has presented today for surgery, with the diagnosis of RETAINED HARDWARE LEFT HIP  The various methods of treatment have been discussed with the patient and family. After consideration of risks, benefits and other options for treatment, the patient has consented to  Procedure(s): HARDWARE REMOVAL LEFT HIP (Left) as a surgical intervention .  The patient's history has been reviewed, patient examined, no change in status, stable for surgery.  I have reviewed the patient's chart and labs.  Questions were answered to the patient's satisfaction.     Tresia Revolorio, Horald Pollen

## 2016-04-06 NOTE — H&P (View-Only) (Signed)
PREOPERATIVE H&P  Chief Complaint: left femur hardware removal  HPI: Carrie Adams is a 60 y.o. female who presents for preoperative history and physical with a diagnosis of symptomatic retained hardware, left hip. Symptoms are rated as moderate to severe, and have been worsening.  This is significantly impairing activities of daily living.  She has elected for surgical management.   Past Medical History:  Diagnosis Date  . Stroke    Past Surgical History:  Procedure Laterality Date  . ABDOMINAL HYSTERECTOMY    . APPENDECTOMY    . COSMETIC SURGERY    . FEMUR IM NAIL  11/10/2011   Procedure: INTRAMEDULLARY (IM) NAIL FEMORAL;  Surgeon: [name], MD;  Location: [place];  Service: Orthopedics;  Laterality: Left;   Social History   Social History  . Marital status: Divorced    Spouse name: N/A  . Number of children: N/A  . Years of education: N/A   Social History Main Topics  . Smoking status: Current Every Day Smoker  . Smokeless tobacco: Not on file  . Alcohol use No  . Drug use: No  . Sexual activity: Not on file   Other Topics Concern  . Not on file   Social History Narrative  . No narrative on file   No family history on file. Allergies  Allergen Reactions  . Amoxicillin Nausea And Vomiting   Prior to Admission medications   Medication Sig Start Date End Date Taking? Authorizing Provider  Doxylamine-DM (NIGHT-TIME COUGH) 6.25-15 MG/15ML LIQD Take 5 mLs by mouth daily as needed. For cough flu symptoms    Historical Provider, MD  guaiFENesin (MUCINEX) 600 MG 12 hr tablet Take 600 mg by mouth daily as needed. For allergies per patient    Historical Provider, MD  Melatonin 3 MG TABS Take 1 tablet by mouth at bedtime.    Historical Provider, MD     Positive ROS: All other systems have been reviewed and were otherwise negative with the exception of those mentioned in the HPI and as above.  Physical Exam: General: Alert, no acute distress Cardiovascular: No pedal  edema Respiratory: No cyanosis, no use of accessory musculature GI: No organomegaly, abdomen is soft and non-tender Skin: No lesions in the area of chief complaint Neurologic: Sensation intact distally Psychiatric: Patient is competent for consent with normal mood and affect Lymphatic: No axillary or cervical lymphadenopathy  MUSCULOSKELETAL: L hip: healed incisions. TTP over hardware and IT band. NVI.  Assessment: symptomatic retained hardware, left hip  Plan: Plan for left femur hardware removal  The risks benefits and alternatives were discussed with the patient including but not limited to the risks of nonoperative treatment, versus surgical intervention including infection, bleeding, nerve injury,  blood clots, cardiopulmonary complications, morbidity, mortality, among others, and they were willing to proceed. She understands that we will need to protect her weight bearing for 6 weeks postop.  Tadeusz Stahl, Horald Pollen, MD Cell [phone]   03/29/2016 12:10 PM

## 2016-04-06 NOTE — Brief Op Note (Signed)
04/06/2016  1:12 PM  PATIENT:  Carrie Adams  60 y.o. female  PRE-OPERATIVE DIAGNOSIS:  RETAINED HARDWARE LEFT HIP  POST-OPERATIVE DIAGNOSIS:  retained hardware left femur  PROCEDURE:  Procedure(s): HARDWARE REMOVAL LEFT HIP (Left)  SURGEON:  Surgeon(s) and Role:    * Rod Can, MD - Primary  PHYSICIAN ASSISTANT: none  ASSISTANTS: none   ANESTHESIA:   general  EBL:  Total I/O In: 1500 [I.V.:1500] Out: 200 [Blood:200]  BLOOD ADMINISTERED:none  DRAINS: none   LOCAL MEDICATIONS USED:  NONE  SPECIMEN:  Source of Specimen:  left femur hardware  DISPOSITION OF SPECIMEN:  PATHOLOGY  COUNTS:  YES  TOURNIQUET:  n/a  DICTATION: .Other Dictation: Dictation Number [id number]  PLAN OF CARE: Admit for overnight observation  PATIENT DISPOSITION:  PACU - hemodynamically stable.   Delay start of Pharmacological VTE agent (>24hrs) due to surgical blood loss or risk of bleeding: no

## 2016-04-06 NOTE — Op Note (Signed)
NAMELORALYE, LOBERG NO.:  [id number]  MEDICAL RECORD NO.:  [id number]  LOCATION:  5003                         FACILITY:  [facility]  PHYSICIAN:  Rod Can, MD     DATE OF BIRTH:  1956/07/12  DATE OF PROCEDURE:  04/06/2016 DATE OF DISCHARGE:                              OPERATIVE REPORT   SURGEON:  Rod Can, MD  ASSISTANT:  Staff.  PREOPERATIVE DIAGNOSIS:  Painful retained hardware, left femur.  POSTOPERATIVE DIAGNOSIS:  Painful retained hardware, left femur.  PROCEDURE PERFORMED:  Removal of implant deep from left femur.  EXPLANTS:  Biomet AFFIXUS nail with lag screw and distal interlocking screw x1.  ANESTHESIA:  General.  EBL:  100 mL.  COMPLICATIONS:  None.  TUBES AND DRAINS:  None.  DISPOSITION:  Stable to PACU.  INDICATIONS:  The patient is a 60 year old female, who had a left cephalomedullary nail placed by Dr. Onnie Graham about 3 years ago.  She has gone on to develop symptomatic painful hardware over the lateral aspect of the left hip.  We managed her conservatively and she failed.  We discussed the risks, benefits, and alternatives to hardware removal, and she elected to proceed.  DESCRIPTION OF PROCEDURE IN DETAIL:  The patient was identified in the holding area using 2 identifiers.  The surgical site was marked by myself.  She was taken to the operating room.  General anesthesia was induced.  She was transferred to the operating room table, flipped in the right lateral decubitus position.  Axillary roll was placed.  She was secured with a bean bag.  The left hip was prepped and draped in normal sterile surgical fashion.  Time-out was called verifying side and site of surgery.  I began by using fluoroscopy to localize her distal interlocking screw.  I made a 1-inch incision, the IT band was split.  I then used a screwdriver to remove the distal interlocking screw without any difficulty.  This was passed to the back table.  I then  used the fluoroscopy to localize the tip of her lag screw.  A 2-inch incision was made just proximal to the tip of the lag screw.  Blunt dissection was performed to the IT band.  I split the IP band in line with fibers.  I was able to palpate the tip of the lag screw.  I cleaned this scar tissue with rongeur.  I then inserted the guide pin into the lag screw. I then inserted the insertion handle, which I threaded into place.  I then turned my attention proximally.  I used fluoroscopy to find the proximal tip of her nail.  I made a 4-inch incision over the proximal aspect of the femur.  I incised the gluteal fascia.  I then placed a Gelpi retractor.  I used a guide pin to find the nail.  I then cleaned the scar tissue as minimal as possible to avoid disruption of the abductors with a rongeur.  I then used the screwdriver to loosen the set screw.  I then threaded the extraction handle into the tip of the nail. Once the extractor was seated within the nail, I then removed the lag screw.  I then backslapped the nail out without any difficulty.  I obtained an AP hip x-ray to demonstrate that there was no hardware in place.  There were no fractures.  I then copiously irrigated all the wounds with saline.  I closed the abductor fascia with #1 Vicryl.  I closed the IT band with #1 Vicryl.  2-0 Vicryl was used for the deep fatty tissue.  I then closed the deep dermal layer with 2-0 interrupted Monocryl.  Skin was closed with staples.  I then applied Dermabond. Once the glue was dried and Aquacel dressing was applied.  The patient was then flipped supine, extubated, taken to PACU in stable condition. Sponge, needle, and instrument counts were correct at the end of the case x2.  There were no known complications.  We will admit the patient for overnight observation.  She will be touchdown weightbearing left lower extremity.  I will place her on aspirin for DVT prophylaxis.  We will plan to  discharge her in the morning after she works with Physical Therapy.          ______________________________ Rod Can, MD     BS/MEDQ  D:  04/06/2016  T:  04/06/2016  Job:  [id number]

## 2016-04-06 NOTE — Anesthesia Procedure Notes (Signed)
Procedure Name: Intubation Performed by: Jenesis Suchy J Pre-anesthesia Checklist: Patient identified, Emergency Drugs available, Suction available, Patient being monitored and Timeout performed Patient Re-evaluated:Patient Re-evaluated prior to inductionOxygen Delivery Method: Circle system utilized Preoxygenation: Pre-oxygenation with 100% oxygen Intubation Type: IV induction Ventilation: Mask ventilation without difficulty Laryngoscope Size: Mac and 3 Grade View: Grade I Tube type: Oral Tube size: 7.0 mm Number of attempts: 1 Airway Equipment and Method: Stylet Placement Confirmation: ETT inserted through vocal cords under direct vision,  positive ETCO2,  CO2 detector and breath sounds checked- equal and bilateral Secured at: 21 cm Tube secured with: Tape Dental Injury: Teeth and Oropharynx as per pre-operative assessment        

## 2016-04-06 NOTE — Transfer of Care (Signed)
Immediate Anesthesia Transfer of Care Note  Patient: Carrie Adams  Procedure(s) Performed: Procedure(s): HARDWARE REMOVAL LEFT HIP (Left)  Patient Location: PACU  Anesthesia Type:General  Level of Consciousness: awake, alert  and oriented  Airway & Oxygen Therapy: Patient Spontanous Breathing and Patient connected to face mask oxygen  Post-op Assessment: Report given to RN and Post -op Vital signs reviewed and stable  Post vital signs: Reviewed and stable  Last Vitals:  Vitals:   04/06/16 0858  BP: 130/70  Pulse: 61  Resp: 16  Temp: 36.3 C    Last Pain:  Vitals:   04/06/16 0858  TempSrc: Oral         Complications: No apparent anesthesia complications

## 2016-04-07 ENCOUNTER — Encounter (HOSPITAL_COMMUNITY): Payer: Self-pay | Admitting: Orthopedic Surgery

## 2016-04-07 DIAGNOSIS — T8484XA Pain due to internal orthopedic prosthetic devices, implants and grafts, initial encounter: Secondary | ICD-10-CM | POA: Diagnosis not present

## 2016-04-07 LAB — BASIC METABOLIC PANEL
Anion gap: 5 (ref 5–15)
BUN: 11 mg/dL (ref 6–20)
CO2: 28 mmol/L (ref 22–32)
Calcium: 8.4 mg/dL — ABNORMAL LOW (ref 8.9–10.3)
Chloride: 106 mmol/L (ref 101–111)
Creatinine, Ser: 0.67 mg/dL (ref 0.44–1.00)
GFR calc Af Amer: >60 mL/min (ref >60)
GFR calc non Af Amer: >60 mL/min (ref >60)
Glucose, Bld: 91 mg/dL (ref 65–99)
Potassium: 3.7 mmol/L (ref 3.5–5.1)
Sodium: 139 mmol/L (ref 135–145)

## 2016-04-07 MED ORDER — HYDROCODONE-ACETAMINOPHEN 5-325 MG PO TABS: 1.0000 | ORAL_TABLET | ORAL | 0 refills | Status: DC | PRN

## 2016-04-07 NOTE — Progress Notes (Signed)
   Subjective:  Patient reports pain as mild to moderate.  Ready to go home. Worked with PT.   Objective:   VITALS:   Vitals:   04/07/16 0302 04/07/16 0304 04/07/16 0615 04/07/16 0617  BP: (!) 105/30 (!) 94/49 (!) 107/31 (!) 97/49  Pulse: 65  66   Resp: 15  15   Temp: 97.8 F (36.6 C)  98.1 F (36.7 C)   TempSrc: Oral  Oral   SpO2: 92%  99%   Weight:      Height:        ABD soft Sensation intact distally Intact pulses distally Dorsiflexion/Plantar flexion intact Incision: dressing C/D/I Compartment soft   Lab Results  Component Value Date   WBC 6.7 04/03/2016   HGB 15.6 (H) 04/03/2016   HCT 47.6 (H) 04/03/2016   MCV 93.7 04/03/2016   PLT 216 04/03/2016   BMET    Component Value Date/Time   NA 139 04/07/2016 0405   K 3.7 04/07/2016 0405   CL 106 04/07/2016 0405   CO2 28 04/07/2016 0405   GLUCOSE 91 04/07/2016 0405   BUN 11 04/07/2016 0405   CREATININE 0.67 04/07/2016 0405   CALCIUM 8.4 (L) 04/07/2016 0405   GFRNONAA >60 04/07/2016 0405   GFRAA >60 04/07/2016 0405     Assessment/Plan: 1 Day Post-Op   Principal Problem:   Painful orthopaedic hardware   TDWB LLE with walker Advance diet Up with therapy  D/c home   Sharleen Szczesny, Horald Pollen 04/07/2016, 11:29 AM   Rod Can, MD Cell [phone]

## 2016-04-07 NOTE — Evaluation (Signed)
Physical Therapy Evaluation Patient Details Name: Carrie Adams MRN: [id number] DOB: [birth year] Today's Date: 04/07/2016   History of Present Illness  s/p Removal of implant deep from left femur due to painful hardware  Clinical Impression  Pt admitted as above and presenting with functional mobility limitations 2* post op WB restrictions.  Pt currently mobilizing at sup level and demonstrating good awareness of and follow through with TWB on L including negotiating stairs.  Pt plans dc home this date with family assist.    Follow Up Recommendations No PT follow up    Equipment Recommendations  None recommended by PT    Recommendations for Other Services       Precautions / Restrictions Precautions Precautions: None;Fall Restrictions Weight Bearing Restrictions: Yes LLE Weight Bearing: Touchdown weight bearing      Mobility  Bed Mobility               General bed mobility comments: NT - pt reports OOB unassisted this am  Transfers Overall transfer level: Needs assistance Equipment used: Rolling walker (2 wheeled) Transfers: Sit to/from Stand Sit to Stand: Supervision         General transfer comment: cues for LE management and use of UEs to self assist  Ambulation/Gait Ambulation/Gait assistance: Min guard;Supervision Ambulation Distance (Feet): 200 Feet Assistive device: Rolling walker (2 wheeled) Gait Pattern/deviations: Step-to pattern;Decreased stance time - left;Shuffle;Trunk flexed Gait velocity: decr Gait velocity interpretation: Below normal speed for age/gender General Gait Details: cues for posture and position from RW.  Pt demonstrating good awareness of TTWB limitations  Stairs Stairs: Yes Stairs assistance: Min assist Stair Management: One rail Right;Step to pattern;Forwards;With crutches Number of Stairs: 4 General stair comments: cues for sequence and foot/crutch placement.    Wheelchair Mobility    Modified Rankin (Stroke Patients  Only)       Balance                                             Pertinent Vitals/Pain Pain Assessment: 0-10 Pain Score: 0-No pain Pain Intervention(s): Monitored during session;Premedicated before session    [place] expects to be discharged to:: Private residence Living Arrangements: Other relatives Available Help at Discharge: Family;Available 24 hours/day Type of Home: House Home Access: Stairs to enter Entrance Stairs-Rails: Psychiatric nurse of Steps: 4+4 Home Layout: One level Home Equipment: Environmental consultant - 2 wheels;Cane - single point;Shower seat      Prior Function Level of Independence: Independent               Hand Dominance        Extremity/Trunk Assessment   Upper Extremity Assessment: Overall WFL for tasks assessed           Lower Extremity Assessment: LLE deficits/detail   LLE Deficits / Details: Pt observed to lift L LE unassisted and actively flex knee to 90 degrees  Cervical / Trunk Assessment: Normal  Communication   Communication: No difficulties  Cognition Arousal/Alertness: Awake/alert Behavior During Therapy: WFL for tasks assessed/performed Overall Cognitive Status: Within Functional Limits for tasks assessed                      General Comments      Exercises        Assessment/Plan    PT Assessment Patient needs continued PT services  PT Diagnosis Difficulty walking  PT Problem List Decreased strength;Decreased range of motion;Decreased activity tolerance;Decreased mobility;Decreased knowledge of use of DME  PT Treatment Interventions DME instruction;Gait training;Stair training;Functional mobility training;Therapeutic activities;Therapeutic exercise;Patient/family education   PT Goals (Current goals can be found in the Care Plan section) Acute Rehab PT Goals Patient Stated Goal: home today PT Goal Formulation: All assessment and education complete, DC  therapy    Frequency Min 1X/week   Barriers to discharge        Co-evaluation               End of Session Equipment Utilized During Treatment: Gait belt Activity Tolerance: Patient tolerated treatment well Patient left: in chair;with call bell/phone within reach Nurse Communication: Mobility status    Functional Assessment Tool Used: Clinic[employer] judgement Functional Limitation: Mobility: Walking and moving around Mobility: Walking and Moving Around Current Status (K9574): At least 1 percent but less than 20 percent impaired, limited or restricted Mobility: Walking and Moving Around Goal Status [phone]): At least 1 percent but less than 20 percent impaired, limited or restricted Mobility: Walking and Moving Around Discharge Status [phone]): At least 1 percent but less than 20 percent impaired, limited or restricted    Time: 1026-1053 PT Time Calculation (min) (ACUTE ONLY): 27 min   Charges:   PT Evaluation $PT Eval Low Complexity: 1 Procedure PT Treatments $Gait Training: 8-22 mins   PT G Codes:   PT G-Codes **NOT FOR INPATIENT CLASS** Functional Assessment Tool Used: Clinicqal judgement Functional Limitation: Mobility: Walking and moving around Mobility: Walking and Moving Around Current Status (R8381): At least 1 percent but less than 20 percent impaired, limited or restricted Mobility: Walking and Moving Around Goal Status [phone]): At least 1 percent but less than 20 percent impaired, limited or restricted Mobility: Walking and Moving Around Discharge Status [phone]): At least 1 percent but less than 20 percent impaired, limited or restricted    [facility] 04/07/2016, 12:29 PM

## 2016-04-07 NOTE — Progress Notes (Signed)
Occupational Therapy Evaluation Patient Details Name: Carrie Adams MRN: [id number] DOB: [birth year] Today's Date: 04/07/2016    History of Present Illness s/p Removal of implant deep from left femur due to painful hardware   Clinical Impression   All OT education completed and pt questions answered. No further OT needs at this time. Will sign off.    Follow Up Recommendations  No OT follow up;Supervision/Assistance - 24 hour    Equipment Recommendations  None recommended by OT    Recommendations for Other Services       Precautions / Restrictions Precautions Precautions: None Restrictions Weight Bearing Restrictions: Yes LLE Weight Bearing: Touchdown weight bearing      Mobility Bed Mobility               General bed mobility comments: NT -- OOB  Transfers Overall transfer level: Needs assistance Equipment used: Rolling walker (2 wheeled) Transfers: Sit to/from Stand Sit to Stand: Supervision         General transfer comment: cues for LE placement    Balance                                            ADL Overall ADL's : Needs assistance/impaired Eating/Feeding: Independent;Sitting   Grooming: Supervision/safety;Standing;Sitting           Upper Body Dressing : Set up;Sitting   Lower Body Dressing: Minimal assistance;Sit to/from stand   Toilet Transfer: Supervision/safety;Ambulation;Comfort height toilet;RW   Toileting- Clothing Manipulation and Hygiene: Supervision/safety;Sitting/lateral lean;Sit to/from stand   Tub/ Shower Transfer: Walk-in shower;Supervision/safety;Ambulation;Shower seat;Rolling walker   Functional mobility during ADLs: Supervision/safety;Rolling walker General ADL Comments: Patient got dressed in personal clothing, practiced toilet/shower transfers following TDWB LLE status. Did very well. Will have family assistance at home as needed. No further OT needs identified.     Vision     Perception      Praxis      Pertinent Vitals/Pain Pain Assessment: 0-10 Pain Score: 0-No pain     Hand Dominance     Extremity/Trunk Assessment Upper Extremity Assessment Upper Extremity Assessment: Overall WFL for tasks assessed   Lower Extremity Assessment Lower Extremity Assessment: Defer to PT evaluation   Cervical / Trunk Assessment Cervical / Trunk Assessment: Normal   Communication Communication Communication: No difficulties   Cognition Arousal/Alertness: Awake/alert Behavior During Therapy: WFL for tasks assessed/performed Overall Cognitive Status: Within Functional Limits for tasks assessed                     General Comments       Exercises       Shoulder Instructions      Home Living Family/patient expects to be discharged to:: Private residence Living Arrangements: Other relatives Available Help at Discharge: Family;Available 24 hours/day Type of Home: House Home Access: Stairs to enter [employer] of Steps: 3-5 Entrance Stairs-Rails: None Home Layout: One level     Bathroom Shower/Tub: Occupational psychologist: Standard     Home Equipment: Environmental consultant - 2 wheels;Cane - single point;Shower seat          Prior Functioning/Environment Level of Independence: Independent             OT Diagnosis: Generalized weakness   OT Problem List: Decreased knowledge of use of DME or AE;Decreased knowledge of precautions   OT Treatment/Interventions:  OT Goals(Current goals can be found in the care plan section) Acute Rehab OT Goals Patient Stated Goal: home today OT Goal Formulation: All assessment and education complete, DC therapy  OT Frequency:     Barriers to D/C:            Co-evaluation              End of Session Equipment Utilized During Treatment: Rolling walker Nurse Communication: Mobility status  Activity Tolerance: Patient tolerated treatment well Patient left: in chair;with call bell/phone within  reach   Time: 0940-1001 OT Time Calculation (min): 21 min Charges:  OT General Charges $OT Visit: 1 Procedure OT Evaluation $OT Eval Low Complexity: 1 Procedure G-Codes: OT G-codes **NOT FOR INPATIENT CLASS** Functional Assessment Tool Used: clinical judgment Functional Limitation: Self care Self Care Current Status (O3785): At least 20 percent but less than 40 percent impaired, limited or restricted Self Care Goal Status (Y8502): At least 1 percent but less than 20 percent impaired, limited or restricted Self Care Discharge Status [phone]): At least 20 percent but less than 40 percent impaired, limited or restricted  Kashina Mecum A 04/07/2016, 10:07 AM

## 2016-04-07 NOTE — Discharge Summary (Signed)
Physician Discharge Summary  Patient ID: HENSLEE LOTTMAN MRN: [id number] DOB/AGE: May 07, 1956 60 y.o.  Admit date: 04/06/2016 Discharge date: 04/07/2016  Admission Diagnoses:  Painful orthopaedic hardware  Discharge Diagnoses:  Principal Problem:   Painful orthopaedic hardware   Past Medical History:  Diagnosis Date  . Anxiety   . Arthritis   . Hypothyroidism   . Stroke [facility])    several, last 2013, balance problems, memory loss occ  . Venous thrombosis     Surgeries: Procedure(s): HARDWARE REMOVAL LEFT HIP on 04/06/2016   Consultants (if any):   Discharged Condition: Improved  Hospital Course: BRITTLEY REGNER is an 60 y.o. female who was admitted 04/06/2016 with a diagnosis of Painful orthopaedic hardware and went to the operating room on 04/06/2016 and underwent the above named procedures.    She was given perioperative antibiotics:  Anti-infectives    Start     Dose/Rate Route Frequency Ordered Stop   04/06/16 1800  ceFAZolin (ANCEF) IVPB 1 g/50 mL premix     1 g 100 mL/hr over 30 Minutes Intravenous Every 6 hours 04/06/16 1342 04/07/16 0532   04/06/16 0859  ceFAZolin (ANCEF) IVPB 2g/100 mL premix     2 g 200 mL/hr over 30 Minutes Intravenous On call to O.R. 04/06/16 1443 04/06/16 1145    .  She was given sequential compression devices, early ambulation, and ASA for DVT prophylaxis.  She benefited maximally from the hospital stay and there were no complications.    Recent vital signs:  Vitals:   04/07/16 0615 04/07/16 0617  BP: (!) 107/31 (!) 97/49  Pulse: 66   Resp: 15   Temp: 98.1 F (36.7 C)     Recent laboratory studies:  Lab Results  Component Value Date   HGB 15.6 (H) 04/03/2016   HGB 9.0 (L) 11/13/2011   HGB 9.0 (L) 11/12/2011   Lab Results  Component Value Date   WBC 6.7 04/03/2016   PLT 216 04/03/2016   No results found for: INR Lab Results  Component Value Date   NA 139 04/07/2016   K 3.7 04/07/2016   CL 106 04/07/2016   CO2 28  04/07/2016   BUN 11 04/07/2016   CREATININE 0.67 04/07/2016   GLUCOSE 91 04/07/2016    Discharge Medications:     Medication List    STOP taking these medications   acetaminophen 500 MG tablet Commonly known as:  TYLENOL     TAKE these medications   aspirin EC 81 MG tablet Take 81 mg by mouth daily.   calcium carbonate 1250 (500 Ca) MG tablet Commonly known as:  OS-CAL - dosed in mg of elemental calcium Take 1 tablet by mouth daily with breakfast.   cholecalciferol 1000 units tablet Commonly known as:  VITAMIN D Take 1,000 Units by mouth daily.   clonazePAM 0.5 MG tablet Commonly known as:  KLONOPIN Take 1 mg by mouth at bedtime.   cyanocobalamin 500 MCG tablet Take 500 mcg by mouth daily.   DULoxetine 60 MG capsule Commonly known as:  CYMBALTA Take 60 mg by mouth 2 (two) times daily.   gabapentin 300 MG capsule Commonly known as:  NEURONTIN Take 600 mg by mouth 3 (three) times daily.   HYDROcodone-acetaminophen 5-325 MG tablet Commonly known as:  NORCO/VICODIN Take 1-2 tablets by mouth every 4 (four) hours as needed (breakthrough pain).   levothyroxine 75 MCG tablet Commonly known as:  SYNTHROID, LEVOTHROID Take 75 mcg by mouth daily.   pravastatin 20 MG tablet  Commonly known as:  PRAVACHOL Take 20 mg by mouth at bedtime.   triamcinolone cream 0.1 % Commonly known as:  KENALOG Apply 1 application topically daily.   vitamin C 500 MG tablet Commonly known as:  ASCORBIC ACID Take 500 mg by mouth daily.       Diagnostic Studies: Dg Pelvis Portable  Result Date: 04/06/2016 CLINICAL DATA:  Left hip hardware removal. EXAM: DG C-ARM 1-60 MIN-NO REPORT; DG HIP (WITH OR WITHOUT PELVIS) 1V*L* ; PORTABLE PELVIS 1-2 VIEWS COMPARISON:  None. FLUOROSCOPY TIME:  Fluoroscopy Time:  1 minutes and 10 seconds Number of Acquired Images:  1 FINDINGS: Single image shows lucency reflecting the removed compression screw intra medullary rod. There is no acute fracture or  evidence of an operative complication. Left hip joint is normally aligned. IMPRESSION: Operative imaging following removal of left hip orthopedic hardware. Electronically Signed   By: Lajean Manes M.D.   On: 04/06/2016 13:52   Dg C-arm 1-60 Min-no Report  Result Date: 04/06/2016 CLINICAL DATA: hip C-ARM 1-60 MINUTES Fluoroscopy was utilized by the requesting physician.  No radiographic interpretation.   Dg Hip Unilat With Pelvis 1v Left  Result Date: 04/06/2016 CLINICAL DATA:  Left hip hardware removal. EXAM: DG C-ARM 1-60 MIN-NO REPORT; DG HIP (WITH OR WITHOUT PELVIS) 1V*L* ; PORTABLE PELVIS 1-2 VIEWS COMPARISON:  None. FLUOROSCOPY TIME:  Fluoroscopy Time:  1 minutes and 10 seconds Number of Acquired Images:  1 FINDINGS: Single image shows lucency reflecting the removed compression screw intra medullary rod. There is no acute fracture or evidence of an operative complication. Left hip joint is normally aligned. IMPRESSION: Operative imaging following removal of left hip orthopedic hardware. Electronically Signed   By: Lajean Manes M.D.   On: 04/06/2016 13:52    Disposition: 01-Home or Self Care  Discharge Instructions    Call MD / Call 911    Complete by:  As directed   If you experience chest pain or shortness of breath, CALL 911 and be transported to the hospital emergency room.  If you develope a fever above 101 F, pus (white drainage) or increased drainage or redness at the wound, or calf pain, call your surgeon's office.   Constipation Prevention    Complete by:  As directed   Drink plenty of fluids.  Prune juice may be helpful.  You may use a stool softener, such as Colace (over the counter) 100 mg twice a day.  Use MiraLax (over the counter) for constipation as needed.   Diet - low sodium heart healthy    Complete by:  As directed   Discharge instructions    Complete by:  As directed   Touch down weight bearing with a walker   Driving restrictions    Complete by:  As directed   No  driving for 6 weeks   Increase activity slowly as tolerated    Complete by:  As directed   Lifting restrictions    Complete by:  As directed   No lifting for 6 weeks      Follow-up Information    Shigeo Baugh, Horald Pollen, MD. Schedule an appointment as soon as possible for a visit in 2 weeks.   Specialty:  Orthopedic Surgery Why:  For suture removal, For wound re-check Contact information: [address] [address] [phone]            Signed: Elie Goody 04/07/2016, 11:32 AM

## 2016-04-07 NOTE — Discharge Instructions (Signed)
Dr. Rod Can Adult Hip & Knee Specialist [facility] [address], [place], [place] [address] [phone]   POSTOPERATIVE DIRECTIONS    Hip Rehabilitation, Guidelines Following Surgery   WEIGHT BEARING Partial weight bearing with assist device as directed.  touch down weight bearing with a walker   [place] items at home which could result in a fall. This includes throw rugs or furniture in walking pathways.  Continue medications as instructed at time of discharge.  You may have some home medications which will be placed on hold until you complete the course of blood thinner medication.  4 days after discharge, you may start showering. No tub baths or soaking your incisions. Do not put on socks or shoes without following the instructions of your caregivers.   Sit on chairs with arms. Use the chair arms to help push yourself up when arising.  Arrange for the use of a toilet seat elevator so you are not sitting low.   Walk with walker as instructed.  You may resume a sexual relationship in one month or when given the OK by your caregiver.  Use walker as long as suggested by your caregivers.  Avoid periods of inactivity such as sitting longer than an hour when not asleep. This helps prevent blood clots.  You may return to work once you are cleared by [employer], [employer].  Do not drive a car for 6 weeks or until released by your surgeon.  Do not drive while taking narcotics.  Wear elastic stockings for two weeks following surgery during the day but you may remove then at night.  Make sure you keep all of your appointments after your operation with all of your doctors and caregivers. You should call the office at the above phone number and make an appointment for approximately two weeks after the date of your surgery. Please pick up a stool softener and laxative for home use as long as you are requiring pain medications.  ICE to the  affected hip every three hours for 30 minutes at a time and then as needed for pain and swelling. Continue to use ice on the hip for pain and swelling from surgery. You may notice swelling that will progress down to the foot and ankle.  This is normal after surgery.  Elevate the leg when you are not up walking on it.   It is important for you to complete the blood thinner medication as prescribed by your doctor.  Continue to use the breathing machine which will help keep your temperature down.  It is common for your temperature to cycle up and down following surgery, especially at night when you are not up moving around and exerting yourself.  The breathing machine keeps your lungs expanded and your temperature down.  RANGE OF MOTION AND STRENGTHENING EXERCISES  These exercises are designed to help you keep full movement of your hip joint. Follow your caregiver's or physical therapist's instructions. Perform all exercises about fifteen times, three times per day or as directed. Exercise both hips, even if you have had only one joint replacement. These exercises can be done on a training (exercise) mat, on the floor, on a table or on a bed. Use whatever works the best and is most comfortable for you. Use music or television while you are exercising so that the exercises are a pleasant break in your day. This will make your life better with the exercises acting as a break in  routine you can look forward to.  Lying on your back, slowly slide your foot toward your buttocks, raising your knee up off the floor. Then slowly slide your foot back down until your leg is straight again.  Lying on your back spread your legs as far apart as you can without causing discomfort.  Lying on your side, raise your upper leg and foot straight up from the floor as far as is comfortable. Slowly lower the leg and repeat.  Lying on your back, tighten up the muscle in the front of your thigh (quadriceps muscles). You can do this by  keeping your leg straight and trying to raise your heel off the floor. This helps strengthen the largest muscle supporting your knee.  Lying on your back, tighten up the muscles of your buttocks both with the legs straight and with the knee bent at a comfortable angle while keeping your heel on the floor.   SKILLED REHAB INSTRUCTIONS: If the patient is transferred to a skilled rehab facility following release from the hospital, a list of the current medications will be sent to the facility for the patient to continue.  When discharged from the skilled rehab facility, please have the facility set up the patient's [place] prior to being released. Also, the skilled facility will be responsible for providing the patient with their medications at time of release from the facility to include their pain medication and their blood thinner medication. If the patient is still at the rehab facility at time of the two week follow up appointment, the skilled rehab facility will also need to assist the patient in arranging follow up appointment in our office and any transportation needs.  MAKE SURE YOU:  Understand these instructions.  Will watch your condition.  Will get help right away if you are not doing well or get worse.  Pick up stool softner and laxative for home use following surgery while on pain medications. Keep dressing clean. You may take showers - no submerging or soaking. Continue to use ice for pain and swelling after surgery. Do not use any lotions or creams on the incision until instructed by your surgeon.

## 2016-04-20 DIAGNOSIS — T85848D Pain due to other internal prosthetic devices, implants and grafts, subsequent encounter: Secondary | ICD-10-CM | POA: Diagnosis not present

## 2016-05-02 DIAGNOSIS — E038 Other specified hypothyroidism: Secondary | ICD-10-CM | POA: Diagnosis not present

## 2016-05-02 DIAGNOSIS — E784 Other hyperlipidemia: Secondary | ICD-10-CM | POA: Diagnosis not present

## 2016-05-03 DIAGNOSIS — E038 Other specified hypothyroidism: Secondary | ICD-10-CM | POA: Diagnosis not present

## 2016-05-03 DIAGNOSIS — R69 Illness, unspecified: Secondary | ICD-10-CM | POA: Diagnosis not present

## 2016-05-03 DIAGNOSIS — J3089 Other allergic rhinitis: Secondary | ICD-10-CM | POA: Diagnosis not present

## 2016-05-03 DIAGNOSIS — Z6821 Body mass index (BMI) 21.0-21.9, adult: Secondary | ICD-10-CM | POA: Diagnosis not present

## 2016-05-17 DIAGNOSIS — Z967 Presence of other bone and tendon implants: Secondary | ICD-10-CM | POA: Diagnosis not present

## 2016-05-31 DIAGNOSIS — L570 Actinic keratosis: Secondary | ICD-10-CM | POA: Diagnosis not present

## 2016-05-31 DIAGNOSIS — X32XXXA Exposure to sunlight, initial encounter: Secondary | ICD-10-CM | POA: Diagnosis not present

## 2016-05-31 DIAGNOSIS — C44529 Squamous cell carcinoma of skin of other part of trunk: Secondary | ICD-10-CM | POA: Diagnosis not present

## 2016-06-22 DIAGNOSIS — M779 Enthesopathy, unspecified: Secondary | ICD-10-CM | POA: Diagnosis not present

## 2016-06-22 DIAGNOSIS — M79672 Pain in left foot: Secondary | ICD-10-CM | POA: Diagnosis not present

## 2016-06-24 DIAGNOSIS — Z23 Encounter for immunization: Secondary | ICD-10-CM | POA: Diagnosis not present

## 2016-06-26 DIAGNOSIS — M79672 Pain in left foot: Secondary | ICD-10-CM | POA: Diagnosis not present

## 2016-06-26 DIAGNOSIS — I739 Peripheral vascular disease, unspecified: Secondary | ICD-10-CM | POA: Diagnosis not present

## 2016-06-26 DIAGNOSIS — M779 Enthesopathy, unspecified: Secondary | ICD-10-CM | POA: Diagnosis not present

## 2016-06-29 DIAGNOSIS — E784 Other hyperlipidemia: Secondary | ICD-10-CM | POA: Diagnosis not present

## 2016-06-29 DIAGNOSIS — E038 Other specified hypothyroidism: Secondary | ICD-10-CM | POA: Diagnosis not present

## 2016-06-29 DIAGNOSIS — R69 Illness, unspecified: Secondary | ICD-10-CM | POA: Diagnosis not present

## 2016-07-04 DIAGNOSIS — X32XXXD Exposure to sunlight, subsequent encounter: Secondary | ICD-10-CM | POA: Diagnosis not present

## 2016-07-04 DIAGNOSIS — Z08 Encounter for follow-up examination after completed treatment for malignant neoplasm: Secondary | ICD-10-CM | POA: Diagnosis not present

## 2016-07-04 DIAGNOSIS — Z85828 Personal history of other malignant neoplasm of skin: Secondary | ICD-10-CM | POA: Diagnosis not present

## 2016-07-04 DIAGNOSIS — L820 Inflamed seborrheic keratosis: Secondary | ICD-10-CM | POA: Diagnosis not present

## 2016-07-04 DIAGNOSIS — L570 Actinic keratosis: Secondary | ICD-10-CM | POA: Diagnosis not present

## 2016-07-05 DIAGNOSIS — M79672 Pain in left foot: Secondary | ICD-10-CM | POA: Diagnosis not present

## 2016-07-05 DIAGNOSIS — M779 Enthesopathy, unspecified: Secondary | ICD-10-CM | POA: Diagnosis not present

## 2016-08-04 DIAGNOSIS — E038 Other specified hypothyroidism: Secondary | ICD-10-CM | POA: Diagnosis not present

## 2016-08-04 DIAGNOSIS — R69 Illness, unspecified: Secondary | ICD-10-CM | POA: Diagnosis not present

## 2016-08-04 DIAGNOSIS — E784 Other hyperlipidemia: Secondary | ICD-10-CM | POA: Diagnosis not present

## 2016-08-18 DIAGNOSIS — R69 Illness, unspecified: Secondary | ICD-10-CM | POA: Diagnosis not present

## 2016-08-18 DIAGNOSIS — E038 Other specified hypothyroidism: Secondary | ICD-10-CM | POA: Diagnosis not present

## 2016-08-18 DIAGNOSIS — Z6821 Body mass index (BMI) 21.0-21.9, adult: Secondary | ICD-10-CM | POA: Diagnosis not present

## 2016-08-18 DIAGNOSIS — I69898 Other sequelae of other cerebrovascular disease: Secondary | ICD-10-CM | POA: Diagnosis not present

## 2016-08-18 DIAGNOSIS — Z79899 Other long term (current) drug therapy: Secondary | ICD-10-CM | POA: Diagnosis not present

## 2016-08-18 DIAGNOSIS — J440 Chronic obstructive pulmonary disease with acute lower respiratory infection: Secondary | ICD-10-CM | POA: Diagnosis not present

## 2016-08-18 LAB — TSH: TSH: 2.4 u[IU]/mL (ref <=5.90)

## 2016-08-28 HISTORY — PX: CAROTID STENT: SHX1301

## 2016-09-11 DIAGNOSIS — J440 Chronic obstructive pulmonary disease with acute lower respiratory infection: Secondary | ICD-10-CM | POA: Diagnosis not present

## 2016-09-11 DIAGNOSIS — E038 Other specified hypothyroidism: Secondary | ICD-10-CM | POA: Diagnosis not present

## 2016-09-11 DIAGNOSIS — R69 Illness, unspecified: Secondary | ICD-10-CM | POA: Diagnosis not present

## 2016-09-28 DIAGNOSIS — Z6821 Body mass index (BMI) 21.0-21.9, adult: Secondary | ICD-10-CM | POA: Diagnosis not present

## 2016-09-28 DIAGNOSIS — J440 Chronic obstructive pulmonary disease with acute lower respiratory infection: Secondary | ICD-10-CM | POA: Diagnosis not present

## 2016-09-28 DIAGNOSIS — J028 Acute pharyngitis due to other specified organisms: Secondary | ICD-10-CM | POA: Diagnosis not present

## 2016-09-28 DIAGNOSIS — R69 Illness, unspecified: Secondary | ICD-10-CM | POA: Diagnosis not present

## 2016-10-23 DIAGNOSIS — E784 Other hyperlipidemia: Secondary | ICD-10-CM | POA: Diagnosis not present

## 2016-10-23 DIAGNOSIS — E038 Other specified hypothyroidism: Secondary | ICD-10-CM | POA: Diagnosis not present

## 2016-11-14 DIAGNOSIS — H02403 Unspecified ptosis of bilateral eyelids: Secondary | ICD-10-CM | POA: Diagnosis not present

## 2016-11-14 DIAGNOSIS — R55 Syncope and collapse: Secondary | ICD-10-CM | POA: Diagnosis not present

## 2016-11-14 DIAGNOSIS — E079 Disorder of thyroid, unspecified: Secondary | ICD-10-CM | POA: Diagnosis not present

## 2016-11-17 DIAGNOSIS — E784 Other hyperlipidemia: Secondary | ICD-10-CM | POA: Diagnosis not present

## 2016-11-17 DIAGNOSIS — E038 Other specified hypothyroidism: Secondary | ICD-10-CM | POA: Diagnosis not present

## 2016-11-28 DIAGNOSIS — G4700 Insomnia, unspecified: Secondary | ICD-10-CM | POA: Diagnosis not present

## 2016-11-28 DIAGNOSIS — R410 Disorientation, unspecified: Secondary | ICD-10-CM | POA: Diagnosis not present

## 2016-11-28 DIAGNOSIS — Z8673 Personal history of transient ischemic attack (TIA), and cerebral infarction without residual deficits: Secondary | ICD-10-CM | POA: Diagnosis not present

## 2016-11-28 DIAGNOSIS — H02401 Unspecified ptosis of right eyelid: Secondary | ICD-10-CM | POA: Diagnosis not present

## 2016-12-12 DIAGNOSIS — Z1389 Encounter for screening for other disorder: Secondary | ICD-10-CM | POA: Diagnosis not present

## 2016-12-12 DIAGNOSIS — E038 Other specified hypothyroidism: Secondary | ICD-10-CM | POA: Diagnosis not present

## 2016-12-12 DIAGNOSIS — Z0000 Encounter for general adult medical examination without abnormal findings: Secondary | ICD-10-CM | POA: Diagnosis not present

## 2016-12-12 DIAGNOSIS — R69 Illness, unspecified: Secondary | ICD-10-CM | POA: Diagnosis not present

## 2016-12-12 DIAGNOSIS — Z6822 Body mass index (BMI) 22.0-22.9, adult: Secondary | ICD-10-CM | POA: Diagnosis not present

## 2016-12-27 ENCOUNTER — Ambulatory Visit (INDEPENDENT_AMBULATORY_CARE_PROVIDER_SITE_OTHER): Payer: Self-pay | Admitting: "Endocrinology

## 2016-12-27 ENCOUNTER — Encounter: Payer: Self-pay | Admitting: "Endocrinology

## 2016-12-27 VITALS — BP 172/80 | HR 65 | Ht 64.0 in | Wt 124.0 lb

## 2016-12-27 DIAGNOSIS — E039 Hypothyroidism, unspecified: Secondary | ICD-10-CM | POA: Diagnosis not present

## 2016-12-27 LAB — T4, FREE: Free T4: 1.2 ng/dL (ref 0.8–1.8)

## 2016-12-27 LAB — TSH: TSH: 5.08 mIU/L — ABNORMAL HIGH

## 2016-12-27 LAB — T3, FREE: T3, Free: 2.3 pg/mL (ref 2.3–4.2)

## 2016-12-27 NOTE — Progress Notes (Signed)
Subjective:    Patient ID: Carrie Adams, female    DOB: June 10, 1956, PCP [name], MD   Past Medical History:  Diagnosis Date  . Anxiety   . Arthritis   . Hypothyroidism   . Stroke [facility])    several, last 2013, balance problems, memory loss occ  . Venous thrombosis    Past Surgical History:  Procedure Laterality Date  . ABDOMINAL HYSTERECTOMY     partial  . APPENDECTOMY     with hysterectomy  . COSMETIC SURGERY Bilateral    breast implants   . FEMUR IM NAIL  11/10/2011   Procedure: INTRAMEDULLARY (IM) NAIL FEMORAL;  Surgeon: [name], MD;  Location: [facility];  Service: Orthopedics;  Laterality: Left;  . HARDWARE REMOVAL Left 04/06/2016   Procedure: HARDWARE REMOVAL LEFT HIP;  Surgeon: Rod Can, MD;  Location: [facility];  Service: Orthopedics;  Laterality: Left;   Social History   Social History  . Marital status: Divorced    Spouse name: N/A  . Number of children: N/A  . Years of education: N/A   Social History Main Topics  . Smoking status: Current Every Day Smoker    Packs/day: 1.00    Years: 33.00    Types: Cigarettes  . Smokeless tobacco: Never Used  . Alcohol use No  . Drug use: No  . Sexual activity: Not Asked   Other Topics Concern  . None   Social History Narrative  . None   Outpatient Encounter Prescriptions as of 12/27/2016  Medication Sig  . aspirin EC 81 MG tablet Take 81 mg by mouth daily.  . calcium carbonate (OS-CAL - DOSED IN MG OF ELEMENTAL CALCIUM) 1250 (500 Ca) MG tablet Take 1 tablet by mouth daily with breakfast.  . DULoxetine (CYMBALTA) 60 MG capsule Take 60 mg by mouth 2 (two) times daily.  [place] Kitchen levothyroxine (SYNTHROID, LEVOTHROID) 75 MCG tablet Take 75 mcg by mouth daily.  . pravastatin (PRAVACHOL) 20 MG tablet Take 20 mg by mouth at bedtime.  . triamcinolone cream (KENALOG) 0.1 % Apply 1 application topically daily.  . vitamin C (ASCORBIC ACID) 500 MG tablet Take 500 mg by mouth daily.  . [DISCONTINUED] cholecalciferol  (VITAMIN D) 1000 units tablet Take 1,000 Units by mouth daily.  . [DISCONTINUED] clonazePAM (KLONOPIN) 0.5 MG tablet Take 1 mg by mouth at bedtime.  . [DISCONTINUED] cyanocobalamin 500 MCG tablet Take 500 mcg by mouth daily.  . [DISCONTINUED] gabapentin (NEURONTIN) 300 MG capsule Take 600 mg by mouth 3 (three) times daily.  . [DISCONTINUED] HYDROcodone-acetaminophen (NORCO/VICODIN) 5-325 MG tablet Take 1-2 tablets by mouth every 4 (four) hours as needed (breakthrough pain).   No facility-administered encounter medications on file as of 12/27/2016.    ALLERGIES: Allergies  Allergen Reactions  . Amoxicillin Nausea And Vomiting    Has patient had a PCN reaction causing immediate rash, facial/tongue/throat swelling, SOB or lightheadedness with hypotension: No Has patient had a PCN reaction causing severe rash involving mucus membranes or skin necrosis: No Has patient had a PCN reaction that required hospitalization No Has patient had a PCN reaction occurring within the last 10 years: No If all of the above answers are "NO", then may proceed with Cephalosporin use.     VACCINATION STATUS:  There is no immunization history on file for this patient.  HPI 61 year old female patient with medical history as above. She is being seen in consultation for long-standing hypothyroidism requested by [name], MD. - He  reports that she was diagnosed with hypothyroidism 5 years ago and was initiated on levothyroxine, currently at 75 g by mouth every morning. She reports compliance. She denies any history of goiter nor thyroid surgery. She denies any history of ablative thyroid therapy. - She has family history of hypothyroidism in one of her sisters as well as in her mother. - She has mostly been light weight most of her adult life. She gained 6 lbs in the last year.  She denies heat/cold intolerance. She is a chronic active smoker. - She does not have recent thyroid function test nor thyroid  sonograms.  Review of Systems  Constitutional: + weight gain, + fatigue, no subjective hyperthermia, no subjective hypothermia Eyes: no blurry vision, no xerophthalmia ENT: no sore throat, no nodules palpated in throat, no dysphagia/odynophagia, no hoarseness Cardiovascular: no Chest Pain, no Shortness of Breath, no palpitations, no leg swelling Respiratory: + cough, no SOB Gastrointestinal: no Nausea/Vomiting/Diarhhea Musculoskeletal: no muscle/joint aches Skin: no rashes Neurological: no tremors, no numbness, no tingling, no dizziness Psychiatric: no depression, no anxiety  Objective:    BP (!) 172/80   Pulse 65   Ht '5\' 4"'$  (1.626 m)   Wt 124 lb (56.2 kg)   BMI 21.28 kg/m   Wt Readings from Last 3 Encounters:  12/27/16 124 lb (56.2 kg)  04/06/16 118 lb (53.5 kg)  04/03/16 118 lb (53.5 kg)    Physical Exam  Constitutional:  Appropriateweight for hight, not in acute distress, normal state of mind Eyes: PERRLA, EOMI, no exophthalmos ENT: moist mucous membranes, palpable thyromegaly, no cervical lymphadenopathy Cardiovascular: normal precordial activity, Regular Rate and Rhythm, no Murmur/Rubs/Gallops Respiratory:  adequate breathing efforts, no gross chest deformity, Clear to auscultation bilaterally Gastrointestinal: abdomen soft, Non -tender, No distension, Bowel Sounds present Musculoskeletal: no gross deformities, strength intact in all four extremities Skin: moist, warm, no rashes Neurological: no tremor with outstretched hands, Deep tendon reflexes normal in all four extremities.  CMP ( most recent) CMP     Component Value Date/Time   NA 139 04/07/2016 0405   K 3.7 04/07/2016 0405   CL 106 04/07/2016 0405   CO2 28 04/07/2016 0405   GLUCOSE 91 04/07/2016 0405   BUN 11 04/07/2016 0405   CREATININE 0.67 04/07/2016 0405   CALCIUM 8.4 (L) 04/07/2016 0405   GFRNONAA >60 04/07/2016 0405   GFRAA >60 04/07/2016 0405   TSH from 08/18/2016 was 2.4.     Assessment  & Plan:   1. Hypothyroidism, unspecified type - This patient is being seen at the kind request of Dr.XAJE A HASANAJ. -I have reviewed her available records which are not current. I have clinically evaluated this patient. She seems to be clinically euthyroid however we need a new set of complete thyroid function test as well as thyroid ultrasound.  - She will return in 2 weeks with her results for evaluation and treatment adjustment if necessary.  -In the meantime,  I have advised her to remain on levothyroxine 75 g by mouth every morning.    - We discussed about correct intake of levothyroxine, at fasting, with water, separated by at least 30 minutes from breakfast, and separated by more than 4 hours from calcium, iron, multivitamins, acid reflux medications (PPIs). -Patient is made aware of the fact that thyroid hormone replacement is needed for life, dose to be adjusted by periodic monitoring of thyroid function tests. - She is light weight Caucasian woman at risk for osteoporosis, I encouraged her to continue follow-up  and repeat bone density at her primary care doctor.   - I advised patient to maintain close follow up with [name], MD for primary care needs. Follow up plan: Return in about 2 weeks (around 01/10/2017) for Thyroid / Neck Ultrasound, labs today.  Glade Lloyd, MD Phone: [phone]  Fax: [phone]   12/27/2016, 11:43 AM

## 2016-12-28 LAB — THYROID PEROXIDASE ANTIBODY: Thyroperoxidase Ab SerPl-aCnc: >900 [IU]/mL — ABNORMAL HIGH (ref <9)

## 2016-12-28 LAB — THYROGLOBULIN ANTIBODY: Thyroglobulin Ab: 1 [IU]/mL (ref <2)

## 2017-01-04 DIAGNOSIS — Z8673 Personal history of transient ischemic attack (TIA), and cerebral infarction without residual deficits: Secondary | ICD-10-CM | POA: Diagnosis not present

## 2017-01-04 DIAGNOSIS — R69 Illness, unspecified: Secondary | ICD-10-CM | POA: Diagnosis not present

## 2017-01-15 DIAGNOSIS — I714 Abdominal aortic aneurysm, without rupture: Secondary | ICD-10-CM | POA: Diagnosis not present

## 2017-01-15 DIAGNOSIS — I6522 Occlusion and stenosis of left carotid artery: Secondary | ICD-10-CM | POA: Diagnosis not present

## 2017-01-19 ENCOUNTER — Ambulatory Visit (HOSPITAL_COMMUNITY)
Admission: RE | Admit: 2017-01-19 | Discharge: 2017-01-19 | Disposition: A | Discharge status: Home / Self Care | Payer: Medicare HMO | Source: Ambulatory Visit | Attending: "Endocrinology | Admitting: "Endocrinology

## 2017-01-19 DIAGNOSIS — E039 Hypothyroidism, unspecified: Secondary | ICD-10-CM | POA: Diagnosis not present

## 2017-01-19 DIAGNOSIS — E041 Nontoxic single thyroid nodule: Secondary | ICD-10-CM | POA: Diagnosis not present

## 2017-01-23 ENCOUNTER — Ambulatory Visit (INDEPENDENT_AMBULATORY_CARE_PROVIDER_SITE_OTHER): Payer: Self-pay | Admitting: "Endocrinology

## 2017-01-23 ENCOUNTER — Encounter: Payer: Self-pay | Admitting: "Endocrinology

## 2017-01-23 VITALS — BP 144/75 | HR 66 | Ht 64.0 in | Wt 122.0 lb

## 2017-01-23 DIAGNOSIS — E063 Autoimmune thyroiditis: Secondary | ICD-10-CM | POA: Insufficient documentation

## 2017-01-23 DIAGNOSIS — E038 Other specified hypothyroidism: Secondary | ICD-10-CM

## 2017-01-23 MED ORDER — LEVOTHYROXINE SODIUM 88 MCG PO TABS: 88.0000 ug | ORAL_TABLET | Freq: Every day | ORAL | 1 refills | Status: DC

## 2017-01-23 NOTE — Progress Notes (Signed)
Subjective:    Patient ID: Carrie Adams, female    DOB: 1956/02/04, PCP [name], MD   Past Medical History:  Diagnosis Date  . Anxiety   . Arthritis   . Hypothyroidism   . Stroke [facility])    several, last 2013, balance problems, memory loss occ  . Venous thrombosis    Past Surgical History:  Procedure Laterality Date  . ABDOMINAL HYSTERECTOMY     partial  . APPENDECTOMY     with hysterectomy  . COSMETIC SURGERY Bilateral    breast implants   . FEMUR IM NAIL  11/10/2011   Procedure: INTRAMEDULLARY (IM) NAIL FEMORAL;  Surgeon: [name], MD;  Location: [facility];  Service: Orthopedics;  Laterality: Left;  . HARDWARE REMOVAL Left 04/06/2016   Procedure: HARDWARE REMOVAL LEFT HIP;  Surgeon: Rod Can, MD;  Location: [facility];  Service: Orthopedics;  Laterality: Left;   Social History   Social History  . Marital status: Divorced    Spouse name: N/A  . Number of children: N/A  . Years of education: N/A   Social History Main Topics  . Smoking status: Current Every Day Smoker    Packs/day: 1.00    Years: 33.00    Types: Cigarettes  . Smokeless tobacco: Never Used  . Alcohol use No  . Drug use: No  . Sexual activity: Not Asked   Other Topics Concern  . None   Social History Narrative  . None   Outpatient Encounter Prescriptions as of 01/23/2017  Medication Sig  . aspirin EC 81 MG tablet Take 81 mg by mouth daily.  . calcium carbonate (OS-CAL - DOSED IN MG OF ELEMENTAL CALCIUM) 1250 (500 Ca) MG tablet Take 1 tablet by mouth daily with breakfast.  . DULoxetine (CYMBALTA) 60 MG capsule Take 60 mg by mouth 2 (two) times daily.  Marland Kitchen levothyroxine (SYNTHROID, LEVOTHROID) 88 MCG tablet Take 1 tablet (88 mcg total) by mouth daily.  . pravastatin (PRAVACHOL) 20 MG tablet Take 20 mg by mouth at bedtime.  . triamcinolone cream (KENALOG) 0.1 % Apply 1 application topically daily.  . vitamin C (ASCORBIC ACID) 500 MG tablet Take 500 mg by mouth daily.  . [DISCONTINUED]  levothyroxine (SYNTHROID, LEVOTHROID) 75 MCG tablet Take 75 mcg by mouth daily.   No facility-administered encounter medications on file as of 01/23/2017.    ALLERGIES: Allergies  Allergen Reactions  . Amoxicillin Nausea And Vomiting    Has patient had a PCN reaction causing immediate rash, facial/tongue/throat swelling, SOB or lightheadedness with hypotension: No Has patient had a PCN reaction causing severe rash involving mucus membranes or skin necrosis: No Has patient had a PCN reaction that required hospitalization No Has patient had a PCN reaction occurring within the last 10 years: No If all of the above answers are "NO", then may proceed with Cephalosporin use.     VACCINATION STATUS:  There is no immunization history on file for this patient.  HPI 61 year old female patient with medical history as above. She is being seen in f/u for long-standing hypothyroidism requested by [name], MD. - she is  currently at 75 g by mouth every morning. She reports compliance. She denies any history of goiter nor thyroid surgery. She denies any history of ablative thyroid therapy. - She has family history of hypothyroidism in one of her sisters as well as in her mother. - She has mostly been light weight most of her adult life. She gained 6  lbs in the last year.  She denies heat/cold intolerance. She is a chronic active smoker. - She does not have recent thyroid function test nor thyroid sonograms.  Review of Systems  Constitutional: + steady weight , + fatigue, no subjective hyperthermia, no subjective hypothermia Eyes: no blurry vision, no xerophthalmia ENT: no sore throat, no nodules palpated in throat, no dysphagia/odynophagia, no hoarseness Cardiovascular: no Chest Pain, no Shortness of Breath, no palpitations, no leg swelling Respiratory: + cough, no SOB Gastrointestinal: no Nausea/Vomiting/Diarhhea Musculoskeletal: no muscle/joint aches Skin: no rashes Neurological: no  tremors, no numbness, no tingling, no dizziness Psychiatric: no depression, no anxiety  Objective:    BP (!) 144/75   Pulse 66   Ht 5\' 4"  (1.626 m)   Wt 122 lb (55.3 kg)   BMI 20.94 kg/m   Wt Readings from Last 3 Encounters:  01/23/17 122 lb (55.3 kg)  12/27/16 124 lb (56.2 kg)  04/06/16 118 lb (53.5 kg)    Physical Exam  Constitutional:  Appropriateweight for hight, not in acute distress, normal state of mind Eyes: PERRLA, EOMI, no exophthalmos ENT: moist mucous membranes, palpable thyroid, no cervical lymphadenopathy Cardiovascular: normal precordial activity, Regular Rate and Rhythm, no Murmur/Rubs/Gallops Respiratory:  adequate breathing efforts, no gross chest deformity, Clear to auscultation bilaterally Gastrointestinal: abdomen soft, Non -tender, No distension, Bowel Sounds present Musculoskeletal: no gross deformities, strength intact in all four extremities Skin: moist, warm, no rashes Neurological: no tremor with outstretched hands, Deep tendon reflexes normal in all four extremities.  TSH from 08/18/2016 was 2.4.  Recent Results (from the past 2160 hour(s))  TSH     Status: Abnormal   Collection Time: 12/27/16 10:36 AM  Result Value Ref Range   TSH 5.08 (H) mIU/L    Comment:   Reference Range   > or = 20 Years  0.40-4.50   Pregnancy Range First trimester  0.26-2.66 Second trimester 0.55-2.73 Third trimester  0.43-2.91     T4, free     Status: None   Collection Time: 12/27/16 10:36 AM  Result Value Ref Range   Free T4 1.2 0.8 - 1.8 ng/dL  Thyroglobulin antibody     Status: None   Collection Time: 12/27/16 10:36 AM  Result Value Ref Range   Thyroglobulin Ab 1 <2 IU/mL  Thyroid peroxidase antibody     Status: Abnormal   Collection Time: 12/27/16 10:36 AM  Result Value Ref Range   Thyroperoxidase Ab SerPl-aCnc >900 (H) <9 IU/mL  T3, free     Status: None   Collection Time: 12/27/16 10:36 AM  Result Value Ref Range   T3, Free 2.3 2.3 - 4.2 pg/mL       Assessment & Plan:   1. Hypothyroidism 2. Hashimoto's thyroiditis  - Based on her thyroid function tests, she will benefit from slight increase in her thyroid hormone. I will prescribe levothyroxine 88 g by mouth every morning.   - We discussed about correct intake of levothyroxine, at fasting, with water, separated by at least 30 minutes from breakfast, and separated by more than 4 hours from calcium, iron, multivitamins, acid reflux medications (PPIs). -Patient is made aware of the fact that thyroid hormone replacement is needed for life, dose to be adjusted by periodic monitoring of thyroid function tests.  - Her thyroid sonograms is unremarkable for nodules. This consistent with heterogeneous texture from chronic thyroiditis. She will not need any intervention at this time, but may need repeat thyroid sonograms in 2 years.  - She is  light weight Caucasian woman at risk for osteoporosis, I encouraged her to continue follow-up and repeat bone density at her primary care doctor.   - I advised patient to maintain close follow up with [name], MD for primary care needs. Follow up plan: Return in about 6 months (around 07/26/2017) for follow up with pre-visit labs.  Glade Lloyd, MD Phone: [phone]  Fax: [phone]   01/23/2017, 9:18 AM

## 2017-04-03 DIAGNOSIS — Z6820 Body mass index (BMI) 20.0-20.9, adult: Secondary | ICD-10-CM | POA: Diagnosis not present

## 2017-04-03 DIAGNOSIS — Z79899 Other long term (current) drug therapy: Secondary | ICD-10-CM | POA: Diagnosis not present

## 2017-04-03 DIAGNOSIS — E038 Other specified hypothyroidism: Secondary | ICD-10-CM | POA: Diagnosis not present

## 2017-04-03 DIAGNOSIS — E784 Other hyperlipidemia: Secondary | ICD-10-CM | POA: Diagnosis not present

## 2017-04-03 DIAGNOSIS — J4520 Mild intermittent asthma, uncomplicated: Secondary | ICD-10-CM | POA: Diagnosis not present

## 2017-04-03 DIAGNOSIS — R69 Illness, unspecified: Secondary | ICD-10-CM | POA: Diagnosis not present

## 2017-04-03 DIAGNOSIS — J018 Other acute sinusitis: Secondary | ICD-10-CM | POA: Diagnosis not present

## 2017-05-30 DIAGNOSIS — S79812A Other specified injuries of left hip, initial encounter: Secondary | ICD-10-CM | POA: Diagnosis not present

## 2017-05-30 DIAGNOSIS — S7002XA Contusion of left hip, initial encounter: Secondary | ICD-10-CM | POA: Diagnosis not present

## 2017-05-30 DIAGNOSIS — M25552 Pain in left hip: Secondary | ICD-10-CM | POA: Diagnosis not present

## 2017-06-05 DIAGNOSIS — S7002XD Contusion of left hip, subsequent encounter: Secondary | ICD-10-CM | POA: Diagnosis not present

## 2017-06-05 DIAGNOSIS — S79812D Other specified injuries of left hip, subsequent encounter: Secondary | ICD-10-CM | POA: Diagnosis not present

## 2017-07-02 DIAGNOSIS — R69 Illness, unspecified: Secondary | ICD-10-CM | POA: Diagnosis not present

## 2017-07-26 ENCOUNTER — Ambulatory Visit: Payer: Self-pay | Admitting: "Endocrinology

## 2017-08-13 ENCOUNTER — Other Ambulatory Visit: Payer: Self-pay | Admitting: "Endocrinology

## 2017-08-14 DIAGNOSIS — E038 Other specified hypothyroidism: Secondary | ICD-10-CM | POA: Diagnosis not present

## 2017-08-14 DIAGNOSIS — E063 Autoimmune thyroiditis: Secondary | ICD-10-CM | POA: Diagnosis not present

## 2017-08-14 LAB — TSH: TSH: 2.2 (ref 0.41–5.90)

## 2017-08-16 DIAGNOSIS — Z6823 Body mass index (BMI) 23.0-23.9, adult: Secondary | ICD-10-CM | POA: Diagnosis not present

## 2017-08-16 DIAGNOSIS — J4520 Mild intermittent asthma, uncomplicated: Secondary | ICD-10-CM | POA: Diagnosis not present

## 2017-08-16 DIAGNOSIS — E038 Other specified hypothyroidism: Secondary | ICD-10-CM | POA: Diagnosis not present

## 2017-08-16 DIAGNOSIS — R69 Illness, unspecified: Secondary | ICD-10-CM | POA: Diagnosis not present

## 2017-08-22 ENCOUNTER — Encounter: Payer: Self-pay | Admitting: "Endocrinology

## 2017-08-22 ENCOUNTER — Ambulatory Visit (INDEPENDENT_AMBULATORY_CARE_PROVIDER_SITE_OTHER): Payer: Medicare HMO | Admitting: "Endocrinology

## 2017-08-22 VITALS — BP 130/73 | HR 59 | Ht 64.0 in | Wt 133.0 lb

## 2017-08-22 DIAGNOSIS — E063 Autoimmune thyroiditis: Secondary | ICD-10-CM | POA: Diagnosis not present

## 2017-08-22 DIAGNOSIS — E038 Other specified hypothyroidism: Secondary | ICD-10-CM | POA: Diagnosis not present

## 2017-08-22 NOTE — Progress Notes (Signed)
Subjective:    Patient ID: Carrie Adams, female    DOB: 1956/02/20, PCP [name], MD   Past Medical History:  Diagnosis Date  . Anxiety   . Arthritis   . Hypothyroidism   . Stroke [facility])    several, last 2013, balance problems, memory loss occ  . Venous thrombosis    Past Surgical History:  Procedure Laterality Date  . ABDOMINAL HYSTERECTOMY     partial  . APPENDECTOMY     with hysterectomy  . COSMETIC SURGERY Bilateral    breast implants   . FEMUR IM NAIL  11/10/2011   Procedure: INTRAMEDULLARY (IM) NAIL FEMORAL;  Surgeon: [name], MD;  Location: [facility];  Service: Orthopedics;  Laterality: Left;  . HARDWARE REMOVAL Left 04/06/2016   Procedure: HARDWARE REMOVAL LEFT HIP;  Surgeon: Rod Can, MD;  Location: [facility];  Service: Orthopedics;  Laterality: Left;   Social History   Socioeconomic History  . Marital status: Divorced    Spouse name: None  . Number of children: None  . Years of education: None  . Highest education level: None  Social Needs  . Financial resource strain: None  . Food insecurity - worry: None  . Food insecurity - inability: None  . Transportation needs - medical: None  . Transportation needs - non-medical: None  Occupational History  . None  Tobacco Use  . Smoking status: Current Every Day Smoker    Packs/day: 1.00    Years: 33.00    Pack years: 33.00    Types: Cigarettes  . Smokeless tobacco: Never Used  Substance and Sexual Activity  . Alcohol use: No  . Drug use: No  . Sexual activity: None  Other Topics Concern  . None  Social History Narrative  . None   Outpatient Encounter Medications as of 08/22/2017  Medication Sig  . aspirin EC 81 MG tablet Take 81 mg by mouth daily.  . calcium carbonate (OS-CAL - DOSED IN MG OF ELEMENTAL CALCIUM) 1250 (500 Ca) MG tablet Take 1 tablet by mouth daily with breakfast.  . DULoxetine (CYMBALTA) 60 MG capsule Take 60 mg by mouth 2 (two) times daily.  Marland Kitchen levothyroxine (SYNTHROID,  LEVOTHROID) 88 MCG tablet take 1 tablet by mouth once daily  . metoprolol succinate (TOPROL-XL) 50 MG 24 hr tablet Take 50 mg by mouth daily.  . pravastatin (PRAVACHOL) 20 MG tablet Take 20 mg by mouth at bedtime.  . triamcinolone cream (KENALOG) 0.1 % Apply 1 application topically daily.  . vitamin C (ASCORBIC ACID) 500 MG tablet Take 500 mg by mouth daily.   No facility-administered encounter medications on file as of 08/22/2017.    ALLERGIES: Allergies  Allergen Reactions  . Amoxicillin Nausea And Vomiting    Has patient had a PCN reaction causing immediate rash, facial/tongue/throat swelling, SOB or lightheadedness with hypotension: No Has patient had a PCN reaction causing severe rash involving mucus membranes or skin necrosis: No Has patient had a PCN reaction that required hospitalization No Has patient had a PCN reaction occurring within the last 10 years: No If all of the above answers are "NO", then may proceed with Cephalosporin use.     VACCINATION STATUS:  There is no immunization history on file for this patient.  HPI 61 year old female patient with medical history as above. She is being seen in f/u for long-standing hypothyroidism due to Hashimoto's thyroiditis.  - she is  currently at 88 g by mouth every morning. She  reports compliance. She denies any history of goiter nor thyroid surgery. She denies any history of ablative thyroid therapy. - She has family history of hypothyroidism in one of her sisters as well as in her mother. - She has mostly been light weight most of her adult life.  She gained  15  lbs in the last year, which is a good development for her.  She denies heat/cold intolerance. She is a chronic active smoker, currently being treated for bronchitis.  Review of Systems  Constitutional: + steady weight gain , + fatigue, no subjective hyperthermia, no subjective hypothermia Eyes: no blurry vision, no xerophthalmia ENT: no sore throat, no nodules  palpated in throat, no dysphagia/odynophagia, no hoarseness Cardiovascular: no Chest Pain, no Shortness of Breath, no palpitations, no leg swelling Respiratory: + cough, no SOB Gastrointestinal: no Nausea/Vomiting/Diarhhea Musculoskeletal: no muscle/joint aches Skin: no rashes Neurological: no tremors, no numbness, no tingling, no dizziness Psychiatric: no depression, no anxiety  Objective:    BP 130/73   Pulse (!) 59   Ht 5\' 4"  (1.626 m)   Wt 133 lb (60.3 kg)   BMI 22.83 kg/m   Wt Readings from Last 3 Encounters:  08/22/17 133 lb (60.3 kg)  01/23/17 122 lb (55.3 kg)  12/27/16 124 lb (56.2 kg)    Physical Exam  Constitutional:  Appropriate weight for hight, not in acute distress, normal state of mind Eyes: PERRLA, EOMI, no exophthalmos ENT: moist mucous membranes, palpable thyroid, no cervical lymphadenopathy Cardiovascular: normal precordial activity, Regular Rate and Rhythm, no Murmur/Rubs/Gallops Respiratory:  adequate breathing efforts, no gross chest deformity, Clear to auscultation bilaterally Gastrointestinal: abdomen soft, Non -tender, No distension, Bowel Sounds present Musculoskeletal: no gross deformities, strength intact in all four extremities Skin: moist, warm, no rashes Neurological: no tremor with outstretched hands, Deep tendon reflexes normal in all four extremities.  TSH from 08/18/2016 was 2.4. 08/14/2017: TSH 2.2, free T4 1. 3   Assessment & Plan:   1. Hypothyroidism 2. Hashimoto's thyroiditis  -Her thyroid function tests are consistent with appropriate replacement. I advised her to continue  levothyroxine 88 g by mouth every morning.   - We discussed about correct intake of levothyroxine, at fasting, with water, separated by at least 30 minutes from breakfast, and separated by more than 4 hours from calcium, iron, multivitamins, acid reflux medications (PPIs). -Patient is made aware of the fact that thyroid hormone replacement is needed for life,  dose to be adjusted by periodic monitoring of thyroid function tests.  - Her thyroid sonograms is unremarkable for nodules. This is consistent with heterogeneous texture from chronic thyroiditis. She will not need any intervention at this time, but may need repeat thyroid sonograms in 2 years.  - She is light weight Caucasian woman at risk for osteoporosis, I encouraged her to continue follow-up and repeat bone density at her primary care doctor.   - I advised patient to maintain close follow up with [name], MD for primary care needs. Follow up plan: Return in about 6 months (around 02/20/2018) for follow up with pre-visit labs.  Glade Lloyd, MD Phone: [phone]  Fax: [phone]   -  This note was partially dictated with voice recognition software. Similar sounding words can be transcribed inadequately or may not  be corrected upon review.  08/22/2017, 2:18 PM

## 2017-09-21 DIAGNOSIS — I714 Abdominal aortic aneurysm, without rupture: Secondary | ICD-10-CM | POA: Diagnosis not present

## 2017-11-13 DIAGNOSIS — L570 Actinic keratosis: Secondary | ICD-10-CM | POA: Diagnosis not present

## 2017-11-13 DIAGNOSIS — C44629 Squamous cell carcinoma of skin of left upper limb, including shoulder: Secondary | ICD-10-CM | POA: Diagnosis not present

## 2017-11-13 DIAGNOSIS — L708 Other acne: Secondary | ICD-10-CM | POA: Diagnosis not present

## 2017-11-13 DIAGNOSIS — X32XXXD Exposure to sunlight, subsequent encounter: Secondary | ICD-10-CM | POA: Diagnosis not present

## 2017-12-06 DIAGNOSIS — M797 Fibromyalgia: Secondary | ICD-10-CM | POA: Diagnosis not present

## 2017-12-06 DIAGNOSIS — Z6823 Body mass index (BMI) 23.0-23.9, adult: Secondary | ICD-10-CM | POA: Diagnosis not present

## 2017-12-06 DIAGNOSIS — J4520 Mild intermittent asthma, uncomplicated: Secondary | ICD-10-CM | POA: Diagnosis not present

## 2017-12-06 DIAGNOSIS — E038 Other specified hypothyroidism: Secondary | ICD-10-CM | POA: Diagnosis not present

## 2017-12-06 DIAGNOSIS — J3089 Other allergic rhinitis: Secondary | ICD-10-CM | POA: Diagnosis not present

## 2017-12-06 DIAGNOSIS — R69 Illness, unspecified: Secondary | ICD-10-CM | POA: Diagnosis not present

## 2017-12-06 DIAGNOSIS — J441 Chronic obstructive pulmonary disease with (acute) exacerbation: Secondary | ICD-10-CM | POA: Diagnosis not present

## 2017-12-06 DIAGNOSIS — I10 Essential (primary) hypertension: Secondary | ICD-10-CM | POA: Diagnosis not present

## 2017-12-21 DIAGNOSIS — J441 Chronic obstructive pulmonary disease with (acute) exacerbation: Secondary | ICD-10-CM | POA: Diagnosis not present

## 2017-12-21 DIAGNOSIS — Z6823 Body mass index (BMI) 23.0-23.9, adult: Secondary | ICD-10-CM | POA: Diagnosis not present

## 2017-12-21 DIAGNOSIS — J4520 Mild intermittent asthma, uncomplicated: Secondary | ICD-10-CM | POA: Diagnosis not present

## 2017-12-21 DIAGNOSIS — I10 Essential (primary) hypertension: Secondary | ICD-10-CM | POA: Diagnosis not present

## 2017-12-21 DIAGNOSIS — E038 Other specified hypothyroidism: Secondary | ICD-10-CM | POA: Diagnosis not present

## 2017-12-21 DIAGNOSIS — M797 Fibromyalgia: Secondary | ICD-10-CM | POA: Diagnosis not present

## 2018-01-09 DIAGNOSIS — Z8673 Personal history of transient ischemic attack (TIA), and cerebral infarction without residual deficits: Secondary | ICD-10-CM | POA: Diagnosis not present

## 2018-02-06 ENCOUNTER — Other Ambulatory Visit: Payer: Self-pay | Admitting: "Endocrinology

## 2018-02-13 DIAGNOSIS — E063 Autoimmune thyroiditis: Secondary | ICD-10-CM | POA: Diagnosis not present

## 2018-02-13 DIAGNOSIS — E038 Other specified hypothyroidism: Secondary | ICD-10-CM | POA: Diagnosis not present

## 2018-02-13 LAB — TSH: TSH: 0.55 (ref <=5.90)

## 2018-02-20 ENCOUNTER — Encounter: Payer: Self-pay | Admitting: "Endocrinology

## 2018-02-20 ENCOUNTER — Ambulatory Visit (INDEPENDENT_AMBULATORY_CARE_PROVIDER_SITE_OTHER): Payer: Medicare HMO | Admitting: "Endocrinology

## 2018-02-20 VITALS — BP 117/68 | HR 61 | Ht 64.0 in | Wt 133.0 lb

## 2018-02-20 DIAGNOSIS — E038 Other specified hypothyroidism: Secondary | ICD-10-CM | POA: Diagnosis not present

## 2018-02-20 DIAGNOSIS — E063 Autoimmune thyroiditis: Secondary | ICD-10-CM | POA: Diagnosis not present

## 2018-02-20 MED ORDER — LEVOTHYROXINE SODIUM 88 MCG PO TABS: 88.0000 ug | ORAL_TABLET | Freq: Every day | ORAL | 3 refills | Status: DC

## 2018-02-20 NOTE — Progress Notes (Signed)
Subjective:    Patient ID: Carrie Adams, female    DOB: 1955/08/29, PCP [name], MD   Past Medical History:  Diagnosis Date  . Anxiety   . Arthritis   . Hypothyroidism   . Stroke [facility])    several, last 2013, balance problems, memory loss occ  . Venous thrombosis    Past Surgical History:  Procedure Laterality Date  . ABDOMINAL HYSTERECTOMY     partial  . APPENDECTOMY     with hysterectomy  . COSMETIC SURGERY Bilateral    breast implants   . FEMUR IM NAIL  11/10/2011   Procedure: INTRAMEDULLARY (IM) NAIL FEMORAL;  Surgeon: [name], MD;  Location: [facility];  Service: Orthopedics;  Laterality: Left;  . HARDWARE REMOVAL Left 04/06/2016   Procedure: HARDWARE REMOVAL LEFT HIP;  Surgeon: Rod Can, MD;  Location: [facility];  Service: Orthopedics;  Laterality: Left;   Social History   Socioeconomic History  . Marital status: Divorced    Spouse name: Not on file  . Number of children: Not on file  . Years of education: Not on file  . Highest education level: Not on file  Occupational History  . Not on file  Social Needs  . Financial resource strain: Not on file  . Food insecurity:    Worry: Not on file    Inability: Not on file  . Transportation needs:    Medical: Not on file    Non-medical: Not on file  Tobacco Use  . Smoking status: Current Every Day Smoker    Packs/day: 1.00    Years: 33.00    Pack years: 33.00    Types: Cigarettes  . Smokeless tobacco: Never Used  Substance and Sexual Activity  . Alcohol use: No  . Drug use: No  . Sexual activity: Not on file  Lifestyle  . Physical activity:    Days per week: Not on file    Minutes per session: Not on file  . Stress: Not on file  Relationships  . Social connections:    Talks on phone: Not on file    Gets together: Not on file    Attends religious service: Not on file    Active member of club or organization: Not on file    Attends meetings of clubs or organizations: Not on file   Relationship status: Not on file  Other Topics Concern  . Not on file  Social History Narrative  . Not on file   Outpatient Encounter Medications as of 02/20/2018  Medication Sig  . aspirin EC 81 MG tablet Take 81 mg by mouth daily.  . calcium carbonate (OS-CAL - DOSED IN MG OF ELEMENTAL CALCIUM) 1250 (500 Ca) MG tablet Take 1 tablet by mouth daily with breakfast.  . DULoxetine (CYMBALTA) 60 MG capsule Take 60 mg by mouth 2 (two) times daily.  Marland Kitchen levothyroxine (SYNTHROID, LEVOTHROID) 88 MCG tablet Take 1 tablet (88 mcg total) by mouth daily.  . metoprolol succinate (TOPROL-XL) 50 MG 24 hr tablet Take 50 mg by mouth daily.  . pravastatin (PRAVACHOL) 20 MG tablet Take 20 mg by mouth at bedtime.  . triamcinolone cream (KENALOG) 0.1 % Apply 1 application topically daily.  . vitamin C (ASCORBIC ACID) 500 MG tablet Take 500 mg by mouth daily.  . [DISCONTINUED] levothyroxine (SYNTHROID, LEVOTHROID) 88 MCG tablet TAKE 1 TABLET BY MOUTH ONCE DAILY   No facility-administered encounter medications on file as of 02/20/2018.    ALLERGIES: Allergies  Allergen Reactions  . Amoxicillin Nausea And Vomiting    Has patient had a PCN reaction causing immediate rash, facial/tongue/throat swelling, SOB or lightheadedness with hypotension: No Has patient had a PCN reaction causing severe rash involving mucus membranes or skin necrosis: No Has patient had a PCN reaction that required hospitalization No Has patient had a PCN reaction occurring within the last 10 years: No If all of the above answers are "NO", then may proceed with Cephalosporin use.     VACCINATION STATUS:  There is no immunization history on file for this patient.  HPI 62 year old female patient with medical history as above.  She is here to follow-up for long-standing hypothyroidism due to Hashimoto's thyroiditis.    - she is  currently at 88 g by mouth every morning. She reports compliance. She denies any history of goiter nor  thyroid surgery. She denies any history of ablative thyroid therapy. - She has family history of hypothyroidism in one of her sisters as well as in her mother. - She has mostly been light weight most of her adult life.  She has a steady weight since last visit.  She denies cold/heat intolerance.  Complains of dry mouth and throat.  She denies heat/cold intolerance. She is a chronic active smoker, currently being treated for bronchitis.  Review of Systems  Constitutional: + steady weight gain , + fatigue, no subjective hyperthermia, no subjective hypothermia Eyes: no blurry vision, no xerophthalmia ENT: no sore throat, no nodules palpated in throat, no dysphagia/odynophagia, no hoarseness Cardiovascular: no chest pain, no palpitations.   Respiratory: + cough, no SOB Gastrointestinal: no Nausea/Vomiting/Diarhhea Musculoskeletal: no muscle/joint aches Skin: no rashes Neurological: no tremors, no numbness, no tingling, no dizziness Psychiatric: no depression, no anxiety  Objective:    BP 117/68   Pulse 61   Ht 5\' 4"  (1.626 m)   Wt 133 lb (60.3 kg)   BMI 22.83 kg/m   Wt Readings from Last 3 Encounters:  02/20/18 133 lb (60.3 kg)  08/22/17 133 lb (60.3 kg)  01/23/17 122 lb (55.3 kg)    Physical Exam  Constitutional:  Appropriate weight for hight, not in acute distress.   Eyes: PERRLA, EOMI, no exophthalmos ENT: moist mucous membranes, palpable thyroid, no cervical lymphadenopathy Musculoskeletal: no gross deformities, strength intact in all four extremities Skin: moist, warm, no rashes Neurological: no tremor with outstretched hands, Deep tendon reflexes normal in all four extremities.  TSH from 08/18/2016 was 2.4. 08/14/2017: TSH 2.2, free T4 1. 3 Labs from February 13, 2018 showed TSH 0.55, free T4 1.4 9   Assessment & Plan:   1. Hypothyroidism 2. Hashimoto's thyroiditis  -Her thyroid function tests are consistent with appropriate replacement.   -She is advised to continue  levothyroxine 88 mcg p.o. daily before breakfast.    - We discussed about correct intake of levothyroxine, at fasting, with water, separated by at least 30 minutes from breakfast, and separated by more than 4 hours from calcium, iron, multivitamins, acid reflux medications (PPIs). -Patient is made aware of the fact that thyroid hormone replacement is needed for life, dose to be adjusted by periodic monitoring of thyroid function tests.  - Her thyroid sonograms is unremarkable for nodules. This is consistent with heterogeneous texture from chronic thyroiditis. She will not need any intervention at this time, but may need repeat thyroid sonograms in 2 years.  - She is light weight Caucasian woman at risk for osteoporosis, I encouraged her to continue follow-up and repeat bone density  at her primary care doctor.  -He is also advised to keep water, during all meals and snacks due to decreased salivary output.  - I advised patient to maintain close follow up with [name], MD for primary care needs. Follow up plan: Return in about 1 year (around 02/21/2019) for follow up with pre-visit labs.  Glade Lloyd, MD Phone: [phone]  Fax: [phone]   -  This note was partially dictated with voice recognition software. Similar sounding words can be transcribed inadequately or may not  be corrected upon review.  02/20/2018, 2:08 PM

## 2018-03-19 DIAGNOSIS — M797 Fibromyalgia: Secondary | ICD-10-CM | POA: Diagnosis not present

## 2018-03-19 DIAGNOSIS — Z0000 Encounter for general adult medical examination without abnormal findings: Secondary | ICD-10-CM | POA: Diagnosis not present

## 2018-03-19 DIAGNOSIS — I10 Essential (primary) hypertension: Secondary | ICD-10-CM | POA: Diagnosis not present

## 2018-03-19 DIAGNOSIS — J4520 Mild intermittent asthma, uncomplicated: Secondary | ICD-10-CM | POA: Diagnosis not present

## 2018-03-19 DIAGNOSIS — E038 Other specified hypothyroidism: Secondary | ICD-10-CM | POA: Diagnosis not present

## 2018-03-19 DIAGNOSIS — R69 Illness, unspecified: Secondary | ICD-10-CM | POA: Diagnosis not present

## 2018-03-19 DIAGNOSIS — Z1389 Encounter for screening for other disorder: Secondary | ICD-10-CM | POA: Diagnosis not present

## 2018-03-19 DIAGNOSIS — J441 Chronic obstructive pulmonary disease with (acute) exacerbation: Secondary | ICD-10-CM | POA: Diagnosis not present

## 2018-03-19 DIAGNOSIS — Z6822 Body mass index (BMI) 22.0-22.9, adult: Secondary | ICD-10-CM | POA: Diagnosis not present

## 2018-03-20 DIAGNOSIS — K635 Polyp of colon: Secondary | ICD-10-CM | POA: Diagnosis not present

## 2018-04-04 DIAGNOSIS — M810 Age-related osteoporosis without current pathological fracture: Secondary | ICD-10-CM | POA: Diagnosis not present

## 2018-04-04 DIAGNOSIS — M8588 Other specified disorders of bone density and structure, other site: Secondary | ICD-10-CM | POA: Diagnosis not present

## 2018-04-04 DIAGNOSIS — M85851 Other specified disorders of bone density and structure, right thigh: Secondary | ICD-10-CM | POA: Diagnosis not present

## 2018-04-04 DIAGNOSIS — M85852 Other specified disorders of bone density and structure, left thigh: Secondary | ICD-10-CM | POA: Diagnosis not present

## 2018-05-23 ENCOUNTER — Ambulatory Visit (INDEPENDENT_AMBULATORY_CARE_PROVIDER_SITE_OTHER): Payer: Medicare HMO | Admitting: Orthopaedic Surgery

## 2018-05-23 ENCOUNTER — Encounter (INDEPENDENT_AMBULATORY_CARE_PROVIDER_SITE_OTHER): Payer: Self-pay | Admitting: Orthopaedic Surgery

## 2018-05-23 ENCOUNTER — Ambulatory Visit (INDEPENDENT_AMBULATORY_CARE_PROVIDER_SITE_OTHER): Payer: Medicare HMO

## 2018-05-23 VITALS — BP 137/87 | HR 68 | Ht 65.0 in | Wt 130.0 lb

## 2018-05-23 DIAGNOSIS — G8929 Other chronic pain: Secondary | ICD-10-CM | POA: Diagnosis not present

## 2018-05-23 DIAGNOSIS — M542 Cervicalgia: Secondary | ICD-10-CM

## 2018-05-23 DIAGNOSIS — M545 Low back pain: Secondary | ICD-10-CM

## 2018-05-23 NOTE — Progress Notes (Signed)
Office Visit Note   Patient: Carrie Adams           Date of Birth: February 01, 1956           MRN: [id number] Visit Date: 05/23/2018              Requested by: [name], MD [place], [place] [id number] PCP: [name], MD   Assessment & Plan: Visit Diagnoses:  1. Neck pain   2. Chronic bilateral low back pain, with sciatica presence unspecified     Plan: X-rays demonstrate some cervical spondylosis as well as lumbar degenerative changes.  We discussed fall prevention avoiding activities that tend to flareup her symptoms.  She has progression of her cervical spondylosis symptoms she can return.  X-rays reviewed with patient I gave her copies.  Pathophysiology discussed.  Follow-Up Instructions: No follow-ups on file.   Orders:  Orders Placed This Encounter  Procedures  . XR Cervical Spine 2 or 3 views  . XR Lumbar Spine 2-3 Views   No orders of the defined types were placed in this encounter.     Procedures: No procedures performed   Clinical Data: No additional findings.   Subjective: Chief Complaint  Patient presents with  . Neck - Pain  . Lower Back - Pain    HPI 62 year old female seen with history of bilateral arm weakness as well as low back pain with pain that radiates into her legs.  She states her symptoms tend to flare intermittently with aching pain.  If she cleans her house one day she has increased pain the following day difficulty being active.  She states both legs seem to be about the same and weakness after activities.  She is used ibuprofen which bothers her stomach.  She is taken Bayer back and body without relief.  At times she has difficulty getting an upright position with low back pain.  Sometimes she is had some intermittent neck pain but not consistent.  She denies any bowel or bladder symptoms no recent  falls.  Past history of hip fracture on the left with later removal of painful hardware.  Review of Systems positive for  Hashimoto's thyroiditis with hypothyroidism.  Previous left hip fracture.  Chronic back and neck pain.  Patient self-reported diagnosis of fibromyalgia.  Positive for depression on Cymbalta.  History of CVA 2004.  Cervical MRI 2004 with C4-5, C5-6 disc protrusion.  Breast augmentation, previous hysterectomy.  14 point review of systems otherwise negative as pertains HPI.   Objective: Vital Signs: BP 137/87   Pulse 68   Ht 5\' 5"  (1.651 m)   Wt 130 lb (59 kg)   BMI 21.63 kg/m   Physical Exam  Constitutional: She is oriented to person, place, and time. She appears well-developed.  HENT:  Head: Normocephalic.  Right Ear: External ear normal.  Left Ear: External ear normal.  Eyes: Pupils are equal, round, and reactive to light.  Neck: No tracheal deviation present. No thyromegaly present.  Cardiovascular: Normal rate.  Pulmonary/Chest: Effort normal.  Abdominal: Soft.  Neurological: She is alert and oriented to person, place, and time.  Skin: Skin is warm and dry.  Psychiatric: She has a normal mood and affect. Her behavior is normal.    Ortho Exam patient has well-healed left scars above the trochanter from hardware placement later hardware removal.  No pain with internal rotation or external rotation of the hip although she lacks 15 degrees internal rotation left hip  versus opposite right.  No hip flexion weakness.  Upper and lower extremity reflexes are symmetrical.  Mild brachial plexus tenderness right and left negative impingement of the shoulders.  No thenar or hyperthenar atrophy.  Wrist flexion extension interossei are strong.  Anterior tib gastrocsoleus are normal.  She has some difficulty with balance not able to heel or toe walk more than a couple steps.  Distal pulses are palpable.  Specialty Comments:  No specialty comments available.  Imaging: Xr Cervical Spine 2 Or 3 Views  Result Date: 05/23/2018 AP lateral cervical spine radiographs are obtained and reviewed.  This  shows minimal narrowing C5-6 small anterior spurs.  Minimal uncovertebral changes normal cervical lordosis. Impression: Normal cervical spine x-rays.  Xr Lumbar Spine 2-3 Views  Result Date: 05/23/2018 Lateral lumbar spine x-rays obtained and reviewed.  This shows no scoliosis.  She has some disc space narrowing at L2-3 some at L34.  No anterolisthesis negative for compression fractures. Impression: Mild multilevel lumbar disc space narrowing.  Negative for acute changes.    PMFS History: Patient Active Problem List   Diagnosis Date Noted  . Hashimoto's thyroiditis 01/23/2017  . Hypothyroidism 12/27/2016  . Painful orthopaedic hardware ([place]) 04/06/2016   Past Medical History:  Diagnosis Date  . Anxiety   . Arthritis   . Hypothyroidism   . Stroke [facility])    several, last 2013, balance problems, memory loss occ  . Venous thrombosis     No family history on file.  Past Surgical History:  Procedure Laterality Date  . ABDOMINAL HYSTERECTOMY     partial  . APPENDECTOMY     with hysterectomy  . COSMETIC SURGERY Bilateral    breast implants   . FEMUR IM NAIL  11/10/2011   Procedure: INTRAMEDULLARY (IM) NAIL FEMORAL;  Surgeon: [name], MD;  Location: [place];  Service: Orthopedics;  Laterality: Left;  . HARDWARE REMOVAL Left 04/06/2016   Procedure: HARDWARE REMOVAL LEFT HIP;  Surgeon: Rod Can, MD;  Location: [facility];  Service: Orthopedics;  Laterality: Left;   Social History   Occupational History  . Not on file  Tobacco Use  . Smoking status: Current Every Day Smoker    Packs/day: 1.00    Years: 33.00    Pack years: 33.00    Types: Cigarettes  . Smokeless tobacco: Never Used  Substance and Sexual Activity  . Alcohol use: No  . Drug use: No  . Sexual activity: Not on file

## 2018-05-24 ENCOUNTER — Encounter (INDEPENDENT_AMBULATORY_CARE_PROVIDER_SITE_OTHER): Payer: Self-pay | Admitting: Orthopaedic Surgery

## 2018-06-07 DIAGNOSIS — R69 Illness, unspecified: Secondary | ICD-10-CM | POA: Diagnosis not present

## 2018-06-26 DIAGNOSIS — I10 Essential (primary) hypertension: Secondary | ICD-10-CM | POA: Diagnosis not present

## 2018-06-26 DIAGNOSIS — M797 Fibromyalgia: Secondary | ICD-10-CM | POA: Diagnosis not present

## 2018-06-26 DIAGNOSIS — Z6821 Body mass index (BMI) 21.0-21.9, adult: Secondary | ICD-10-CM | POA: Diagnosis not present

## 2018-06-26 DIAGNOSIS — J449 Chronic obstructive pulmonary disease, unspecified: Secondary | ICD-10-CM | POA: Diagnosis not present

## 2018-06-26 DIAGNOSIS — J4520 Mild intermittent asthma, uncomplicated: Secondary | ICD-10-CM | POA: Diagnosis not present

## 2018-06-26 DIAGNOSIS — R69 Illness, unspecified: Secondary | ICD-10-CM | POA: Diagnosis not present

## 2018-06-26 DIAGNOSIS — E038 Other specified hypothyroidism: Secondary | ICD-10-CM | POA: Diagnosis not present

## 2018-09-23 DIAGNOSIS — E038 Other specified hypothyroidism: Secondary | ICD-10-CM | POA: Diagnosis not present

## 2018-09-23 DIAGNOSIS — E063 Autoimmune thyroiditis: Secondary | ICD-10-CM | POA: Diagnosis not present

## 2018-09-23 LAB — TSH: TSH: 0.71 (ref 0.41–5.90)

## 2018-10-07 DIAGNOSIS — I714 Abdominal aortic aneurysm, without rupture: Secondary | ICD-10-CM | POA: Diagnosis not present

## 2018-10-14 DIAGNOSIS — Z6821 Body mass index (BMI) 21.0-21.9, adult: Secondary | ICD-10-CM | POA: Diagnosis not present

## 2018-10-14 DIAGNOSIS — J4520 Mild intermittent asthma, uncomplicated: Secondary | ICD-10-CM | POA: Diagnosis not present

## 2018-10-14 DIAGNOSIS — E038 Other specified hypothyroidism: Secondary | ICD-10-CM | POA: Diagnosis not present

## 2018-10-14 DIAGNOSIS — J449 Chronic obstructive pulmonary disease, unspecified: Secondary | ICD-10-CM | POA: Diagnosis not present

## 2018-10-14 DIAGNOSIS — M797 Fibromyalgia: Secondary | ICD-10-CM | POA: Diagnosis not present

## 2018-10-14 DIAGNOSIS — I10 Essential (primary) hypertension: Secondary | ICD-10-CM | POA: Diagnosis not present

## 2018-10-14 DIAGNOSIS — R69 Illness, unspecified: Secondary | ICD-10-CM | POA: Diagnosis not present

## 2018-11-19 DIAGNOSIS — Z7982 Long term (current) use of aspirin: Secondary | ICD-10-CM | POA: Diagnosis not present

## 2018-11-19 DIAGNOSIS — M436 Torticollis: Secondary | ICD-10-CM | POA: Diagnosis not present

## 2018-11-19 DIAGNOSIS — E7800 Pure hypercholesterolemia, unspecified: Secondary | ICD-10-CM | POA: Diagnosis not present

## 2018-11-19 DIAGNOSIS — R69 Illness, unspecified: Secondary | ICD-10-CM | POA: Diagnosis not present

## 2018-11-19 DIAGNOSIS — Z79899 Other long term (current) drug therapy: Secondary | ICD-10-CM | POA: Diagnosis not present

## 2018-11-19 DIAGNOSIS — R918 Other nonspecific abnormal finding of lung field: Secondary | ICD-10-CM | POA: Diagnosis not present

## 2018-11-19 DIAGNOSIS — R079 Chest pain, unspecified: Secondary | ICD-10-CM | POA: Diagnosis not present

## 2018-11-19 DIAGNOSIS — E039 Hypothyroidism, unspecified: Secondary | ICD-10-CM | POA: Diagnosis not present

## 2018-11-19 DIAGNOSIS — Z8673 Personal history of transient ischemic attack (TIA), and cerebral infarction without residual deficits: Secondary | ICD-10-CM | POA: Diagnosis not present

## 2018-11-19 DIAGNOSIS — M25512 Pain in left shoulder: Secondary | ICD-10-CM | POA: Diagnosis not present

## 2018-11-19 DIAGNOSIS — M79602 Pain in left arm: Secondary | ICD-10-CM | POA: Diagnosis not present

## 2018-11-19 DIAGNOSIS — Z8249 Family history of ischemic heart disease and other diseases of the circulatory system: Secondary | ICD-10-CM | POA: Diagnosis not present

## 2019-01-28 ENCOUNTER — Telehealth: Payer: Self-pay | Admitting: "Endocrinology

## 2019-01-28 NOTE — Telephone Encounter (Signed)
Patient called and would like to know if someone could call [facility] ahead and have her lab order sent there for her upcoming appt

## 2019-01-28 NOTE — Telephone Encounter (Signed)
Lab order has been faxed to [facility]

## 2019-02-18 ENCOUNTER — Telehealth: Payer: Self-pay | Admitting: "Endocrinology

## 2019-02-18 DIAGNOSIS — I10 Essential (primary) hypertension: Secondary | ICD-10-CM | POA: Diagnosis not present

## 2019-02-18 DIAGNOSIS — M797 Fibromyalgia: Secondary | ICD-10-CM | POA: Diagnosis not present

## 2019-02-18 DIAGNOSIS — J449 Chronic obstructive pulmonary disease, unspecified: Secondary | ICD-10-CM | POA: Diagnosis not present

## 2019-02-18 DIAGNOSIS — Z6822 Body mass index (BMI) 22.0-22.9, adult: Secondary | ICD-10-CM | POA: Diagnosis not present

## 2019-02-18 DIAGNOSIS — R69 Illness, unspecified: Secondary | ICD-10-CM | POA: Diagnosis not present

## 2019-02-18 DIAGNOSIS — E038 Other specified hypothyroidism: Secondary | ICD-10-CM | POA: Diagnosis not present

## 2019-02-18 NOTE — Telephone Encounter (Signed)
Patient needs an updated lab order faxed to [facility] for her upcoming appt on 02/24/19. She is going Thursday.

## 2019-02-18 NOTE — Telephone Encounter (Signed)
Done

## 2019-02-20 ENCOUNTER — Other Ambulatory Visit: Payer: Self-pay | Admitting: "Endocrinology

## 2019-02-20 DIAGNOSIS — E063 Autoimmune thyroiditis: Secondary | ICD-10-CM | POA: Diagnosis not present

## 2019-02-20 DIAGNOSIS — E038 Other specified hypothyroidism: Secondary | ICD-10-CM | POA: Diagnosis not present

## 2019-02-24 ENCOUNTER — Encounter: Payer: Self-pay | Admitting: "Endocrinology

## 2019-02-24 ENCOUNTER — Ambulatory Visit (INDEPENDENT_AMBULATORY_CARE_PROVIDER_SITE_OTHER): Payer: Medicare HMO | Admitting: "Endocrinology

## 2019-02-24 ENCOUNTER — Other Ambulatory Visit: Payer: Self-pay

## 2019-02-24 VITALS — BP 118/70 | HR 61 | Ht 65.0 in | Wt 135.0 lb

## 2019-02-24 DIAGNOSIS — E063 Autoimmune thyroiditis: Secondary | ICD-10-CM | POA: Diagnosis not present

## 2019-02-24 DIAGNOSIS — E038 Other specified hypothyroidism: Secondary | ICD-10-CM | POA: Diagnosis not present

## 2019-02-24 MED ORDER — LEVOTHYROXINE SODIUM 88 MCG PO TABS: ORAL_TABLET | ORAL | 4 refills | Status: DC

## 2019-02-24 NOTE — Progress Notes (Signed)
Endocrinology follow-up note   Subjective:    Patient ID: Carrie Adams, female    DOB: 1956/04/21, PCP [name], MD   Past Medical History:  Diagnosis Date  . Anxiety   . Arthritis   . Hypothyroidism   . Stroke [facility])    several, last 2013, balance problems, memory loss occ  . Venous thrombosis    Past Surgical History:  Procedure Laterality Date  . ABDOMINAL HYSTERECTOMY     partial  . APPENDECTOMY     with hysterectomy  . COSMETIC SURGERY Bilateral    breast implants   . FEMUR IM NAIL  11/10/2011   Procedure: INTRAMEDULLARY (IM) NAIL FEMORAL;  Surgeon: [name], MD;  Location: [place];  Service: Orthopedics;  Laterality: Left;  . HARDWARE REMOVAL Left 04/06/2016   Procedure: HARDWARE REMOVAL LEFT HIP;  Surgeon: Rod Can, MD;  Location: [facility];  Service: Orthopedics;  Laterality: Left;   Social History   Socioeconomic History  . Marital status: Divorced    Spouse name: Not on file  . Number of children: Not on file  . Years of education: Not on file  . Highest education level: Not on file  Occupational History  . Not on file  Social Needs  . Financial resource strain: Not on file  . Food insecurity    Worry: Not on file    Inability: Not on file  . Transportation needs    Medical: Not on file    Non-medical: Not on file  Tobacco Use  . Smoking status: Current Every Day Smoker    Packs/day: 1.00    Years: 33.00    Pack years: 33.00    Types: Cigarettes  . Smokeless tobacco: Never Used  Substance and Sexual Activity  . Alcohol use: No  . Drug use: No  . Sexual activity: Not on file  Lifestyle  . Physical activity    Days per week: Not on file    Minutes per session: Not on file  . Stress: Not on file  Relationships  . Social Herbalist on phone: Not on file    Gets together: Not on file    Attends religious service: Not on file    Active member of club or organization: Not on file    Attends meetings of clubs or  organizations: Not on file    Relationship status: Not on file  Other Topics Concern  . Not on file  Social History Narrative  . Not on file   Outpatient Encounter Medications as of 02/24/2019  Medication Sig  . aspirin EC 81 MG tablet Take 81 mg by mouth daily.  . calcium carbonate (OS-CAL - DOSED IN MG OF ELEMENTAL CALCIUM) 1250 (500 Ca) MG tablet Take 1 tablet by mouth daily with breakfast.  . DULoxetine (CYMBALTA) 60 MG capsule Take 60 mg by mouth 2 (two) times daily.  Marland Kitchen levocetirizine (XYZAL) 5 MG tablet TK 1 T PO D UTD  . levothyroxine (SYNTHROID) 88 MCG tablet TAKE 1 TABLET(88 MCG) BY MOUTH DAILY  . metoprolol succinate (TOPROL-XL) 50 MG 24 hr tablet Take 50 mg by mouth daily.  Marland Kitchen omeprazole (PRILOSEC) 20 MG capsule daily.  . pravastatin (PRAVACHOL) 20 MG tablet Take 20 mg by mouth at bedtime.  . triamcinolone cream (KENALOG) 0.1 % Apply 1 application topically daily.  . vitamin C (ASCORBIC ACID) 500 MG tablet Take 500 mg by mouth daily.  . [DISCONTINUED] levothyroxine (SYNTHROID) 88 MCG tablet  TAKE 1 TABLET(88 MCG) BY MOUTH DAILY  . [DISCONTINUED] ranitidine (ZANTAC) 300 MG tablet TK 1 T PO QD AFTER DINNER   No facility-administered encounter medications on file as of 02/24/2019.    ALLERGIES: Allergies  Allergen Reactions  . Amoxicillin Nausea And Vomiting    Has patient had a PCN reaction causing immediate rash, facial/tongue/throat swelling, SOB or lightheadedness with hypotension: No Has patient had a PCN reaction causing severe rash involving mucus membranes or skin necrosis: No Has patient had a PCN reaction that required hospitalization No Has patient had a PCN reaction occurring within the last 10 years: No If all of the above answers are "NO", then may proceed with Cephalosporin use.     VACCINATION STATUS:  There is no immunization history on file for this patient.  HPI 63 year old female patient with medical history as above.  She is here to follow-up for  long-standing hypothyroidism due to Hashimoto's thyroiditis.   -She is currently on levothyroxine 88 mcg p.o. every morning.  She reports compliance to her medication.  She has no new complaints today.   - She denies any history of goiter nor thyroid surgery. She denies any history of ablative thyroid therapy. - She has family history of hypothyroidism in one of her sisters as well as in her mother. - She has mostly been light weight most of her adult life.  She has a steady weight since last visit.  She denies cold/heat intolerance.  Complains of dry mouth and throat.  She denies heat/cold intolerance. She is a chronic active smoker, currently being treated for bronchitis.  Review of Systems  Constitutional: + steady weight gain , + fatigue, no subjective hyperthermia, no subjective hypothermia Eyes: no blurry vision, no xerophthalmia ENT: no sore throat, no nodules palpated in throat, no dysphagia/odynophagia, no hoarseness Cardiovascular: no chest pain, no palpitations.   Respiratory: + cough, no SOB Gastrointestinal: no Nausea/Vomiting/Diarhhea Musculoskeletal: no muscle/joint aches Skin: no rashes Neurological: no tremors, no numbness, no tingling, no dizziness Psychiatric: no depression, no anxiety  Objective:    BP 118/70   Pulse 61   Ht 5\' 5"  (1.651 m)   Wt 135 lb (61.2 kg)   BMI 22.47 kg/m   Wt Readings from Last 3 Encounters:  02/24/19 135 lb (61.2 kg)  05/23/18 130 lb (59 kg)  02/20/18 133 lb (60.3 kg)    Physical Exam  Constitutional:  not in acute distress, normal state of mind Eyes:  EOMI, no exophthalmos Neck: Supple Respiratory: Adequate breathing efforts Musculoskeletal: no gross deformities, strength intact in all four extremities Skin:  no rashes, no hyperemia Neurological: no tremor with outstretched hands.   TSH from 08/18/2016 was 2.4. 08/14/2017: TSH 2.2, free T4 1. 3 Labs from February 13, 2018 showed TSH 0.55, free T4 1.4 9  September 23, 2018 labs  TSH 0-7 1, free T4 1.53 February 20, 2019 total T4 9.3   Assessment & Plan:   1. Hypothyroidism 2. Hashimoto's thyroiditis  -Her thyroid function tests are consistent with appropriate replacement.   -She is advised to continue  levothyroxine 88 mcg p.o. daily before breakfast.    - We discussed about the correct intake of her thyroid hormone, on empty stomach at fasting, with water, separated by at least 30 minutes from breakfast and other medications,  and separated by more than 4 hours from calcium, iron, multivitamins, acid reflux medications (PPIs). -Patient is made aware of the fact that thyroid hormone replacement is needed for life, dose to  be adjusted by periodic monitoring of thyroid function tests.   - Her prior thyroid sonogram is unremarkable for nodules. This is consistent with heterogeneous texture from chronic thyroiditis. She will not need any intervention at this time, but may need repeat thyroid sonograms in 1 years.  - She is light weight Caucasian woman at risk for osteoporosis, I encouraged her to continue follow-up and repeat bone density at her primary care doctor.     - I advised patient to maintain close follow up with [name], MD for primary care needs.   Time for this visit: 15 minutes. Carrie Adams  participated in the discussions, expressed understanding, and voiced agreement with the above plans.  All questions were answered to her satisfaction. she is encouraged to contact clinic should she have any questions or concerns prior to her return visit.  Follow up plan: Return in about 1 year (around 02/24/2020) for Follow up with Pre-visit Labs.  Glade Lloyd, MD Phone: [phone]  Fax: [phone]   -  This note was partially dictated with voice recognition software. Similar sounding words can be transcribed inadequately or may not  be corrected upon review.  02/24/2019, 2:25 PM

## 2019-03-25 DIAGNOSIS — I6522 Occlusion and stenosis of left carotid artery: Secondary | ICD-10-CM | POA: Diagnosis not present

## 2019-04-10 DIAGNOSIS — I6523 Occlusion and stenosis of bilateral carotid arteries: Secondary | ICD-10-CM | POA: Diagnosis not present

## 2019-04-10 DIAGNOSIS — I6502 Occlusion and stenosis of left vertebral artery: Secondary | ICD-10-CM | POA: Diagnosis not present

## 2019-04-17 DIAGNOSIS — I6521 Occlusion and stenosis of right carotid artery: Secondary | ICD-10-CM | POA: Diagnosis not present

## 2019-04-17 DIAGNOSIS — I714 Abdominal aortic aneurysm, without rupture: Secondary | ICD-10-CM | POA: Diagnosis not present

## 2019-04-28 DIAGNOSIS — I6521 Occlusion and stenosis of right carotid artery: Secondary | ICD-10-CM | POA: Diagnosis not present

## 2019-04-28 DIAGNOSIS — Z20828 Contact with and (suspected) exposure to other viral communicable diseases: Secondary | ICD-10-CM | POA: Diagnosis not present

## 2019-04-28 DIAGNOSIS — Z01812 Encounter for preprocedural laboratory examination: Secondary | ICD-10-CM | POA: Diagnosis not present

## 2019-05-02 DIAGNOSIS — K219 Gastro-esophageal reflux disease without esophagitis: Secondary | ICD-10-CM | POA: Diagnosis not present

## 2019-05-02 DIAGNOSIS — I6521 Occlusion and stenosis of right carotid artery: Secondary | ICD-10-CM | POA: Diagnosis not present

## 2019-05-02 DIAGNOSIS — E7800 Pure hypercholesterolemia, unspecified: Secondary | ICD-10-CM | POA: Diagnosis not present

## 2019-05-02 DIAGNOSIS — R69 Illness, unspecified: Secondary | ICD-10-CM | POA: Diagnosis not present

## 2019-05-02 DIAGNOSIS — R200 Anesthesia of skin: Secondary | ICD-10-CM | POA: Diagnosis not present

## 2019-05-02 DIAGNOSIS — Z79899 Other long term (current) drug therapy: Secondary | ICD-10-CM | POA: Diagnosis not present

## 2019-05-02 DIAGNOSIS — E039 Hypothyroidism, unspecified: Secondary | ICD-10-CM | POA: Diagnosis not present

## 2019-05-02 DIAGNOSIS — I10 Essential (primary) hypertension: Secondary | ICD-10-CM | POA: Diagnosis not present

## 2019-05-02 DIAGNOSIS — I714 Abdominal aortic aneurysm, without rupture: Secondary | ICD-10-CM | POA: Diagnosis not present

## 2019-05-02 DIAGNOSIS — Z8673 Personal history of transient ischemic attack (TIA), and cerebral infarction without residual deficits: Secondary | ICD-10-CM | POA: Diagnosis not present

## 2019-05-02 DIAGNOSIS — Z7982 Long term (current) use of aspirin: Secondary | ICD-10-CM | POA: Diagnosis not present

## 2019-05-28 DIAGNOSIS — Z6822 Body mass index (BMI) 22.0-22.9, adult: Secondary | ICD-10-CM | POA: Diagnosis not present

## 2019-05-28 DIAGNOSIS — R69 Illness, unspecified: Secondary | ICD-10-CM | POA: Diagnosis not present

## 2019-05-28 DIAGNOSIS — J449 Chronic obstructive pulmonary disease, unspecified: Secondary | ICD-10-CM | POA: Diagnosis not present

## 2019-05-28 DIAGNOSIS — I10 Essential (primary) hypertension: Secondary | ICD-10-CM | POA: Diagnosis not present

## 2019-05-28 DIAGNOSIS — E038 Other specified hypothyroidism: Secondary | ICD-10-CM | POA: Diagnosis not present

## 2019-05-28 DIAGNOSIS — M797 Fibromyalgia: Secondary | ICD-10-CM | POA: Diagnosis not present

## 2019-05-28 DIAGNOSIS — Z0000 Encounter for general adult medical examination without abnormal findings: Secondary | ICD-10-CM | POA: Diagnosis not present

## 2019-05-28 DIAGNOSIS — Z1389 Encounter for screening for other disorder: Secondary | ICD-10-CM | POA: Diagnosis not present

## 2019-06-13 DIAGNOSIS — I6522 Occlusion and stenosis of left carotid artery: Secondary | ICD-10-CM | POA: Diagnosis not present

## 2019-07-01 DIAGNOSIS — K219 Gastro-esophageal reflux disease without esophagitis: Secondary | ICD-10-CM | POA: Diagnosis not present

## 2019-07-01 DIAGNOSIS — R1013 Epigastric pain: Secondary | ICD-10-CM | POA: Diagnosis not present

## 2019-07-01 DIAGNOSIS — D3502 Benign neoplasm of left adrenal gland: Secondary | ICD-10-CM | POA: Diagnosis not present

## 2019-07-01 DIAGNOSIS — I776 Arteritis, unspecified: Secondary | ICD-10-CM | POA: Diagnosis not present

## 2019-07-01 DIAGNOSIS — R69 Illness, unspecified: Secondary | ICD-10-CM | POA: Diagnosis not present

## 2019-07-01 DIAGNOSIS — Z79899 Other long term (current) drug therapy: Secondary | ICD-10-CM | POA: Diagnosis not present

## 2019-07-01 DIAGNOSIS — L959 Vasculitis limited to the skin, unspecified: Secondary | ICD-10-CM | POA: Diagnosis not present

## 2019-07-01 DIAGNOSIS — I714 Abdominal aortic aneurysm, without rupture: Secondary | ICD-10-CM | POA: Diagnosis not present

## 2019-07-01 DIAGNOSIS — D3501 Benign neoplasm of right adrenal gland: Secondary | ICD-10-CM | POA: Diagnosis not present

## 2019-07-01 DIAGNOSIS — I10 Essential (primary) hypertension: Secondary | ICD-10-CM | POA: Diagnosis not present

## 2019-07-01 DIAGNOSIS — E7800 Pure hypercholesterolemia, unspecified: Secondary | ICD-10-CM | POA: Diagnosis not present

## 2019-07-01 DIAGNOSIS — Z23 Encounter for immunization: Secondary | ICD-10-CM | POA: Diagnosis not present

## 2019-07-25 DIAGNOSIS — R69 Illness, unspecified: Secondary | ICD-10-CM | POA: Diagnosis not present

## 2019-07-25 DIAGNOSIS — Z48812 Encounter for surgical aftercare following surgery on the circulatory system: Secondary | ICD-10-CM | POA: Diagnosis not present

## 2019-07-25 DIAGNOSIS — L959 Vasculitis limited to the skin, unspecified: Secondary | ICD-10-CM | POA: Diagnosis not present

## 2019-07-25 DIAGNOSIS — I776 Arteritis, unspecified: Secondary | ICD-10-CM | POA: Diagnosis not present

## 2019-07-25 DIAGNOSIS — I714 Abdominal aortic aneurysm, without rupture: Secondary | ICD-10-CM | POA: Diagnosis not present

## 2019-08-19 DIAGNOSIS — M7989 Other specified soft tissue disorders: Secondary | ICD-10-CM | POA: Diagnosis not present

## 2019-08-20 DIAGNOSIS — M7989 Other specified soft tissue disorders: Secondary | ICD-10-CM | POA: Diagnosis not present

## 2019-08-20 DIAGNOSIS — R600 Localized edema: Secondary | ICD-10-CM | POA: Diagnosis not present

## 2019-08-29 HISTORY — PX: ABDOMINAL AORTIC ANEURYSM REPAIR: SUR1152

## 2019-09-01 ENCOUNTER — Ambulatory Visit: Payer: Medicare HMO | Attending: Internal Medicine

## 2019-09-01 DIAGNOSIS — R0981 Nasal congestion: Secondary | ICD-10-CM | POA: Diagnosis not present

## 2019-09-01 DIAGNOSIS — R05 Cough: Secondary | ICD-10-CM | POA: Diagnosis not present

## 2019-09-01 DIAGNOSIS — J0100 Acute maxillary sinusitis, unspecified: Secondary | ICD-10-CM | POA: Diagnosis not present

## 2019-09-01 DIAGNOSIS — J029 Acute pharyngitis, unspecified: Secondary | ICD-10-CM | POA: Diagnosis not present

## 2019-09-03 ENCOUNTER — Telehealth: Payer: Self-pay | Admitting: *Deleted

## 2019-09-03 NOTE — Telephone Encounter (Signed)
Patient called for covid results ,had test at [facility] urgent care advised to call there for results .

## 2019-10-13 DIAGNOSIS — I10 Essential (primary) hypertension: Secondary | ICD-10-CM | POA: Diagnosis not present

## 2019-10-13 DIAGNOSIS — E038 Other specified hypothyroidism: Secondary | ICD-10-CM | POA: Diagnosis not present

## 2019-10-13 DIAGNOSIS — Z6822 Body mass index (BMI) 22.0-22.9, adult: Secondary | ICD-10-CM | POA: Diagnosis not present

## 2019-10-13 DIAGNOSIS — J449 Chronic obstructive pulmonary disease, unspecified: Secondary | ICD-10-CM | POA: Diagnosis not present

## 2019-10-13 DIAGNOSIS — M545 Low back pain: Secondary | ICD-10-CM | POA: Diagnosis not present

## 2019-10-13 DIAGNOSIS — Z0000 Encounter for general adult medical examination without abnormal findings: Secondary | ICD-10-CM | POA: Diagnosis not present

## 2019-10-13 DIAGNOSIS — R69 Illness, unspecified: Secondary | ICD-10-CM | POA: Diagnosis not present

## 2019-10-13 DIAGNOSIS — M797 Fibromyalgia: Secondary | ICD-10-CM | POA: Diagnosis not present

## 2019-10-13 LAB — TSH: TSH: 5.54 (ref 0.41–5.90)

## 2019-10-13 LAB — LIPID PANEL
Cholesterol: 231 — AB (ref 0–200)
HDL: 53 (ref 35–70)
LDL Cholesterol: 152
Triglycerides: 146 (ref 40–160)

## 2019-10-13 LAB — BASIC METABOLIC PANEL
BUN: 10 (ref 4–21)
Creatinine: 0.8 (ref 0.5–1.1)

## 2019-10-20 DIAGNOSIS — I739 Peripheral vascular disease, unspecified: Secondary | ICD-10-CM | POA: Diagnosis not present

## 2019-10-20 DIAGNOSIS — Z8679 Personal history of other diseases of the circulatory system: Secondary | ICD-10-CM | POA: Diagnosis not present

## 2019-10-22 ENCOUNTER — Encounter: Payer: Self-pay | Admitting: "Endocrinology

## 2019-10-22 ENCOUNTER — Ambulatory Visit (INDEPENDENT_AMBULATORY_CARE_PROVIDER_SITE_OTHER): Payer: Medicare HMO | Admitting: "Endocrinology

## 2019-10-22 DIAGNOSIS — E038 Other specified hypothyroidism: Secondary | ICD-10-CM | POA: Diagnosis not present

## 2019-10-22 DIAGNOSIS — E063 Autoimmune thyroiditis: Secondary | ICD-10-CM

## 2019-10-22 NOTE — Progress Notes (Signed)
10/22/2019  Endocrinology follow-up note   Subjective:    Patient ID: Carrie Adams, female    DOB: 1956-01-01, PCP [name], MD   Past Medical History:  Diagnosis Date  . Anxiety   . Arthritis   . Hypothyroidism   . Stroke [facility])    several, last 2013, balance problems, memory loss occ  . Venous thrombosis    Past Surgical History:  Procedure Laterality Date  . ABDOMINAL HYSTERECTOMY     partial  . APPENDECTOMY     with hysterectomy  . COSMETIC SURGERY Bilateral    breast implants   . FEMUR IM NAIL  11/10/2011   Procedure: INTRAMEDULLARY (IM) NAIL FEMORAL;  Surgeon: [name], MD;  Location: [place];  Service: Orthopedics;  Laterality: Left;  . HARDWARE REMOVAL Left 04/06/2016   Procedure: HARDWARE REMOVAL LEFT HIP;  Surgeon: Rod Can, MD;  [facility];  Service: Orthopedics;  Laterality: Left;   Social History   Socioeconomic History  . Marital status: Divorced    Spouse name: Not on file  . Number of children: Not on file  . Years of education: Not on file  . Highest education level: Not on file  Occupational History  . Not on file  Tobacco Use  . Smoking status: Current Every Day Smoker    Packs/day: 1.00    Years: 33.00    Pack years: 33.00    Types: Cigarettes  . Smokeless tobacco: Never Used  Substance and Sexual Activity  . Alcohol use: No  . Drug use: No  . Sexual activity: Not on file  Other Topics Concern  . Not on file  Social History Narrative  . Not on file   Social Determinants of Health   Financial Resource Strain:   . Difficulty of Paying Living Expenses: Not on file  Food Insecurity:   . Worried About Charity fundraiser in the Last Year: Not on file  . Ran Out of Food in the Last Year: Not on file  Transportation Needs:   . Lack of Transportation (Medical): Not on file  . Lack of Transportation (Non-Medical): Not on file  Physical Activity:   . Days of Exercise per Week: Not on file  . Minutes of Exercise per  Session: Not on file  Stress:   . Feeling of Stress : Not on file  Social Connections:   . Frequency of Communication with Friends and Family: Not on file  . Frequency of Social Gatherings with Friends and Family: Not on file  . Attends Religious Services: Not on file  . Active Member of Clubs or Organizations: Not on file  . Attends Archivist Meetings: Not on file  . Marital Status: Not on file   Outpatient Encounter Medications as of 10/22/2019  Medication Sig  . aspirin EC 81 MG tablet Take 81 mg by mouth daily.  . calcium carbonate (OS-CAL - DOSED IN MG OF ELEMENTAL CALCIUM) 1250 (500 Ca) MG tablet Take 1 tablet by mouth daily with breakfast.  . DULoxetine (CYMBALTA) 60 MG capsule Take 60 mg by mouth 2 (two) times daily.  Marland Kitchen levocetirizine (XYZAL) 5 MG tablet TK 1 T PO D UTD  . levothyroxine (SYNTHROID) 88 MCG tablet TAKE 1 TABLET(88 MCG) BY MOUTH DAILY  . metoprolol succinate (TOPROL-XL) 50 MG 24 hr tablet Take 50 mg by mouth daily.  Marland Kitchen omeprazole (PRILOSEC) 20 MG capsule daily.  . pravastatin (PRAVACHOL) 20 MG tablet Take 20 mg by mouth  at bedtime.  . triamcinolone cream (KENALOG) 0.1 % Apply 1 application topically daily.  . vitamin C (ASCORBIC ACID) 500 MG tablet Take 500 mg by mouth daily.   No facility-administered encounter medications on file as of 10/22/2019.   ALLERGIES: Allergies  Allergen Reactions  . Amoxicillin Nausea And Vomiting    Has patient had a PCN reaction causing immediate rash, facial/tongue/throat swelling, SOB or lightheadedness with hypotension: No Has patient had a PCN reaction causing severe rash involving mucus membranes or skin necrosis: No Has patient had a PCN reaction that required hospitalization No Has patient had a PCN reaction occurring within the last 10 years: No If all of the above answers are "NO", then may proceed with Cephalosporin use.     VACCINATION STATUS: Immunization History  Administered Date(s) Administered  .  Influenza-Unspecified 05/28/2018    HPI 64 year old female patient with medical history as above.  She is is being engaged in telehealth via telephone in follow-up for longstanding hypothyroidism due to Hashimoto's thyroiditis.   -She is currently on levothyroxine 88 mcg p.o. every morning.  She reports that recently her PMD performed thyroid function test as showing higher TSH of 5.5 and suggested increase in her levothyroxine.  However she did not feel a new prescription and stay on that 88 mcg.  She has no new complaints today.  She will base 134 pounds, unchanged from her last visit.   - She denies any history of goiter nor thyroid surgery. She denies any history of ablative thyroid therapy. - She has family history of hypothyroidism in one of her sisters as well as in her mother. - She has mostly been light weight most of her adult life.   She denies cold/heat intolerance.  Complains of dry mouth and throat.  She denies heat/cold intolerance. She is a chronic active smoker, currently being treated for bronchitis.  Review of Systems Limited as above.  Objective:    There were no vitals taken for this visit.  Wt Readings from Last 3 Encounters:  02/24/19 135 lb (61.2 kg)  05/23/18 130 lb (59 kg)  02/20/18 133 lb (60.3 kg)      TSH from 08/18/2016 was 2.4. 08/14/2017: TSH 2.2, free T4 1. 3 Labs from February 13, 2018 showed TSH 0.55, free T4 1.4 9  September 23, 2018 labs TSH 0-7 1, free T4 1.53 February 20, 2019 total T4 9.3 Recent Results (from the past 2160 hour(s))  Basic metabolic panel     Status: None   Collection Time: 10/13/19 12:00 AM  Result Value Ref Range   BUN 10 4 - 21   Creatinine 0.8 0.5 - 1.1  Lipid panel     Status: Abnormal   Collection Time: 10/13/19 12:00 AM  Result Value Ref Range   Triglycerides 146 40 - 160   Cholesterol 231 (A) 0 - 200   HDL 53 35 - 70   LDL Cholesterol 152   TSH     Status: None   Collection Time: 10/13/19 12:00 AM  Result Value Ref  Range   TSH 5.54 0.41 - 5.90     Assessment & Plan:   1. Hypothyroidism 2. Hashimoto's thyroiditis  -Her previsit labs included only TSH, which is elevated at 5.5 and indicated for under replacement.  However, she will need a more complete thyroid function test before making adjustment in her levothyroxine. She is willing to go to lab for free T4 TSH, while she stays on  levothyroxine 88 mcg p.o.  daily before breakfast.   - We discussed about the correct intake of her thyroid hormone, on empty stomach at fasting, with water, separated by at least 30 minutes from breakfast and other medications,  and separated by more than 4 hours from calcium, iron, multivitamins, acid reflux medications (PPIs). -Patient is made aware of the fact that thyroid hormone replacement is needed for life, dose to be adjusted by periodic monitoring of thyroid function tests.   - Her prior thyroid sonogram is unremarkable for nodules. This is consistent with heterogeneous texture from chronic thyroiditis. She will not need any intervention at this time, but may need repeat thyroid sonograms in 1 years.  - I advised patient to maintain close follow up with [name], MD for primary care needs.     - Time spent on this patient care encounter:  20 minutes of which 50% was spent in  counseling and the rest reviewing  her current and  previous labs / studies and medications  doses and developing a plan for long term care. Carrie Adams  participated in the discussions, expressed understanding, and voiced agreement with the above plans.  All questions were answered to her satisfaction. she is encouraged to contact clinic should she have any questions or concerns prior to her return visit.   Follow up plan: Return in about 1 week (around 10/29/2019) for Follow up with Pre-visit Labs.  Glade Lloyd, MD Phone: [phone]  Fax: [phone]   -  This note was partially dictated with voice recognition software.  Similar sounding words can be transcribed inadequately or may not  be corrected upon review.  10/22/2019, 11:02 AM

## 2019-10-27 DIAGNOSIS — E038 Other specified hypothyroidism: Secondary | ICD-10-CM | POA: Diagnosis not present

## 2019-10-27 DIAGNOSIS — E063 Autoimmune thyroiditis: Secondary | ICD-10-CM | POA: Diagnosis not present

## 2019-10-27 LAB — TSH: TSH: 6.32 — AB (ref 0.41–5.90)

## 2019-10-29 ENCOUNTER — Encounter: Payer: Self-pay | Admitting: "Endocrinology

## 2019-10-29 ENCOUNTER — Ambulatory Visit (INDEPENDENT_AMBULATORY_CARE_PROVIDER_SITE_OTHER): Payer: Medicare HMO | Admitting: "Endocrinology

## 2019-10-29 ENCOUNTER — Other Ambulatory Visit: Payer: Self-pay

## 2019-10-29 DIAGNOSIS — E038 Other specified hypothyroidism: Secondary | ICD-10-CM | POA: Diagnosis not present

## 2019-10-29 DIAGNOSIS — E063 Autoimmune thyroiditis: Secondary | ICD-10-CM

## 2019-10-29 MED ORDER — LEVOTHYROXINE SODIUM 100 MCG PO TABS: ORAL_TABLET | ORAL | 3 refills | Status: DC

## 2019-10-29 NOTE — Progress Notes (Addendum)
10/29/2019                                Endocrinology Telehealth Visit Follow up Note -During COVID -19 Pandemic  I connected with Carrie Adams on 10/30/2019   by telephone and verified that I am speaking with the correct person using two identifiers. Carrie Adams, 1956/03/26. she has verbally consented to this visit. All issues noted in this document were discussed and addressed. The format was not optimal for physical exam.    Subjective:    Patient ID: Carrie Adams, female    DOB: 01-18-1956, PCP [name], MD   Past Medical History:  Diagnosis Date  . Anxiety   . Arthritis   . Hypothyroidism   . Stroke [facility])    several, last 2013, balance problems, memory loss occ  . Venous thrombosis    Past Surgical History:  Procedure Laterality Date  . ABDOMINAL HYSTERECTOMY     partial  . APPENDECTOMY     with hysterectomy  . COSMETIC SURGERY Bilateral    breast implants   . FEMUR IM NAIL  11/10/2011   Procedure: INTRAMEDULLARY (IM) NAIL FEMORAL;  Surgeon: [name], MD;  Location: [facility];  Service: Orthopedics;  Laterality: Left;  . HARDWARE REMOVAL Left 04/06/2016   Procedure: HARDWARE REMOVAL LEFT HIP;  Surgeon: Rod Can, MD;  Location: [facility];  Service: Orthopedics;  Laterality: Left;   Social History   Socioeconomic History  . Marital status: Divorced    Spouse name: Not on file  . Number of children: Not on file  . Years of education: Not on file  . Highest education level: Not on file  Occupational History  . Not on file  Tobacco Use  . Smoking status: Current Every Day Smoker    Packs/day: 1.00    Years: 33.00    Pack years: 33.00    Types: Cigarettes  . Smokeless tobacco: Never Used  Substance and Sexual Activity  . Alcohol use: No  . Drug use: No  . Sexual activity: Not on file  Other Topics Concern  . Not on file  Social History Narrative  . Not on file   Social Determinants of Health   Financial Resource Strain:   . Difficulty of  Paying Living Expenses: Not on file  Food Insecurity:   . Worried About Charity fundraiser in the Last Year: Not on file  . Ran Out of Food in the Last Year: Not on file  Transportation Needs:   . Lack of Transportation (Medical): Not on file  . Lack of Transportation (Non-Medical): Not on file  Physical Activity:   . Days of Exercise per Week: Not on file  . Minutes of Exercise per Session: Not on file  Stress:   . Feeling of Stress : Not on file  Social Connections:   . Frequency of Communication with Friends and Family: Not on file  . Frequency of Social Gatherings with Friends and Family: Not on file  . Attends Religious Services: Not on file  . Active Member of Clubs or Organizations: Not on file  . Attends Archivist Meetings: Not on file  . Marital Status: Not on file   Outpatient Encounter Medications as of 10/29/2019  Medication Sig  . aspirin EC 81 MG tablet Take 81 mg by mouth daily.  . calcium carbonate (OS-CAL - DOSED IN MG OF ELEMENTAL  CALCIUM) 1250 (500 Ca) MG tablet Take 1 tablet by mouth daily with breakfast.  . DULoxetine (CYMBALTA) 60 MG capsule Take 60 mg by mouth 2 (two) times daily.  Marland Kitchen levocetirizine (XYZAL) 5 MG tablet TK 1 T PO D UTD  . levothyroxine (SYNTHROID) 100 MCG tablet TAKE 1 TABLET(88 MCG) BY MOUTH DAILY  . metoprolol succinate (TOPROL-XL) 50 MG 24 hr tablet Take 50 mg by mouth daily.  Marland Kitchen omeprazole (PRILOSEC) 20 MG capsule daily.  . pravastatin (PRAVACHOL) 20 MG tablet Take 20 mg by mouth at bedtime.  . triamcinolone cream (KENALOG) 0.1 % Apply 1 application topically daily.  . vitamin C (ASCORBIC ACID) 500 MG tablet Take 500 mg by mouth daily.  . [DISCONTINUED] levothyroxine (SYNTHROID) 88 MCG tablet TAKE 1 TABLET(88 MCG) BY MOUTH DAILY   No facility-administered encounter medications on file as of 10/29/2019.   ALLERGIES: Allergies  Allergen Reactions  . Amoxicillin Nausea And Vomiting    Has patient had a PCN reaction causing  immediate rash, facial/tongue/throat swelling, SOB or lightheadedness with hypotension: No Has patient had a PCN reaction causing severe rash involving mucus membranes or skin necrosis: No Has patient had a PCN reaction that required hospitalization No Has patient had a PCN reaction occurring within the last 10 years: No If all of the above answers are "NO", then may proceed with Cephalosporin use.     VACCINATION STATUS: Immunization History  Administered Date(s) Administered  . Influenza-Unspecified 05/28/2018    HPI 64 year old female patient with medical history as above.  She is is being engaged in telehealth via telephone in follow-up for longstanding hypothyroidism due to Hashimoto's thyroiditis.   -She is currently on levothyroxine 88 mcg p.o. daily before breakfast.  Her previsit labs show abnormal TSH of 6.32, free T4 1.1.   She has no new complaints today.  She will base 134 pounds, unchanged from her last visit.   - She denies any history of goiter nor thyroid surgery. She denies any history of ablative thyroid therapy. - She has family history of hypothyroidism in one of her sisters as well as in her mother. - She has mostly been light weight most of her adult life.   She denies cold/heat intolerance.  Complains of dry mouth and throat.  She denies heat/cold intolerance. She is a chronic active smoker, currently being treated for bronchitis.  Review of Systems Limited as above.  Objective:    There were no vitals taken for this visit.  Wt Readings from Last 3 Encounters:  02/24/19 135 lb (61.2 kg)  05/23/18 130 lb (59 kg)  02/20/18 133 lb (60.3 kg)      TSH from 08/18/2016 was 2.4. 08/14/2017: TSH 2.2, free T4 1. 3 Labs from February 13, 2018 showed TSH 0.55, free T4 1.4 9  September 23, 2018 labs TSH 0-7 1, free T4 1.53 February 20, 2019 total T4 9.3 Recent Results (from the past 2160 hour(s))  Basic metabolic panel     Status: None   Collection Time: 10/13/19 12:00  AM  Result Value Ref Range   BUN 10 4 - 21   Creatinine 0.8 0.5 - 1.1  Lipid panel     Status: Abnormal   Collection Time: 10/13/19 12:00 AM  Result Value Ref Range   Triglycerides 146 40 - 160   Cholesterol 231 (A) 0 - 200   HDL 53 35 - 70   LDL Cholesterol 152   TSH     Status: None   Collection  Time: 10/13/19 12:00 AM  Result Value Ref Range   TSH 5.54 0.41 - 5.90  TSH     Status: Abnormal   Collection Time: 10/29/19 12:00 AM  Result Value Ref Range   TSH 6.32 (A) 0.41 - 5.90    Comment: free t4 1.1     Assessment & Plan:   1. Hypothyroidism 2. Hashimoto's thyroiditis  -Her previsit labs for thyroid function tests are consistent with inadequate replacement.  She is advised to pick up a higher dose of levothyroxine, 100 mcg daily before breakfast.    - We discussed about the correct intake of her thyroid hormone, on empty stomach at fasting, with water, separated by at least 30 minutes from breakfast and other medications,  and separated by more than 4 hours from calcium, iron, multivitamins, acid reflux medications (PPIs). -Patient is made aware of the fact that thyroid hormone replacement is needed for life, dose to be adjusted by periodic monitoring of thyroid function tests.   - Her prior thyroid sonogram is unremarkable for nodules. This is consistent with heterogeneous texture from chronic thyroiditis/Hashimoto's thyroiditis. She will not need any intervention at this time, but may need repeat thyroid sonograms in 1 years.  - I advised patient to maintain close follow up with [name], MD for primary care needs.      - Time spent on this patient care encounter:  15 minutes of which 50% was spent in  counseling and the rest reviewing  her current and  previous labs / studies and medications  doses and developing a plan for long term care. Carrie Adams  participated in the discussions, expressed understanding, and voiced agreement with the above plans.  All  questions were answered to her satisfaction. she is encouraged to contact clinic should she have any questions or concerns prior to her return visit.   Follow up plan: Return in about 3 months (around 01/29/2020) for Follow up with Pre-visit Labs.  Glade Lloyd, MD Phone: [phone]  Fax: [phone]   -  This note was partially dictated with voice recognition software. Similar sounding words can be transcribed inadequately or may not  be corrected upon review.  10/29/2019, 10:33 AM

## 2019-11-03 ENCOUNTER — Telehealth: Payer: Self-pay | Admitting: "Endocrinology

## 2019-11-03 ENCOUNTER — Other Ambulatory Visit: Payer: Self-pay

## 2019-11-03 MED ORDER — LEVOTHYROXINE SODIUM 100 MCG PO TABS: ORAL_TABLET | ORAL | 3 refills | Status: DC

## 2019-11-03 NOTE — Telephone Encounter (Signed)
Resent prescription refill to [facility] in [place].

## 2019-11-03 NOTE — Telephone Encounter (Signed)
Can you resend her thyroid medication, pharmacy said they do not have it. thanks

## 2019-11-06 DIAGNOSIS — I714 Abdominal aortic aneurysm, without rupture: Secondary | ICD-10-CM | POA: Diagnosis not present

## 2019-11-10 ENCOUNTER — Ambulatory Visit: Payer: Medicare HMO | Admitting: "Endocrinology

## 2019-11-10 ENCOUNTER — Other Ambulatory Visit: Payer: Self-pay

## 2019-11-17 ENCOUNTER — Other Ambulatory Visit: Payer: Self-pay

## 2019-11-17 ENCOUNTER — Ambulatory Visit (INDEPENDENT_AMBULATORY_CARE_PROVIDER_SITE_OTHER): Payer: Medicare HMO | Admitting: "Endocrinology

## 2019-11-17 ENCOUNTER — Encounter: Payer: Self-pay | Admitting: "Endocrinology

## 2019-11-17 VITALS — BP 149/78 | HR 61 | Ht 65.0 in | Wt 134.0 lb

## 2019-11-17 DIAGNOSIS — E049 Nontoxic goiter, unspecified: Secondary | ICD-10-CM | POA: Insufficient documentation

## 2019-11-17 DIAGNOSIS — E038 Other specified hypothyroidism: Secondary | ICD-10-CM

## 2019-11-17 DIAGNOSIS — E063 Autoimmune thyroiditis: Secondary | ICD-10-CM

## 2019-11-17 DIAGNOSIS — E040 Nontoxic diffuse goiter: Secondary | ICD-10-CM | POA: Insufficient documentation

## 2019-11-17 NOTE — Progress Notes (Signed)
11/17/2019  Endocrinology follow-up note   Subjective:    Patient ID: Carrie Adams, female    DOB: June 23, 1956, PCP [name], MD   Past Medical History:  Diagnosis Date  . Anxiety   . Arthritis   . Hypothyroidism   . Stroke [facility])    several, last 2013, balance problems, memory loss occ  . Venous thrombosis    Past Surgical History:  Procedure Laterality Date  . ABDOMINAL HYSTERECTOMY     partial  . APPENDECTOMY     with hysterectomy  . COSMETIC SURGERY Bilateral    breast implants   . FEMUR IM NAIL  11/10/2011   Procedure: INTRAMEDULLARY (IM) NAIL FEMORAL;  Surgeon: [name], MD;  Location: [place];  Service: Orthopedics;  Laterality: Left;  . HARDWARE REMOVAL Left 04/06/2016   Procedure: HARDWARE REMOVAL LEFT HIP;  Surgeon: Rod Can, MD;  Location: [facility];  Service: Orthopedics;  Laterality: Left;   Social History   Socioeconomic History  . Marital status: Divorced    Spouse name: Not on file  . Number of children: Not on file  . Years of education: Not on file  . Highest education level: Not on file  Occupational History  . Not on file  Tobacco Use  . Smoking status: Current Every Day Smoker    Packs/day: 1.00    Years: 33.00    Pack years: 33.00    Types: Cigarettes  . Smokeless tobacco: Never Used  Substance and Sexual Activity  . Alcohol use: No  . Drug use: No  . Sexual activity: Not on file  Other Topics Concern  . Not on file  Social History Narrative  . Not on file   Social Determinants of Health   Financial Resource Strain:   . Difficulty of Paying Living Expenses:   Food Insecurity:   . Worried About Charity fundraiser in the Last Year:   . Arboriculturist in the Last Year:   Transportation Needs:   . Film/video editor (Medical):   Marland Kitchen Lack of Transportation (Non-Medical):   Physical Activity:   . Days of Exercise per Week:   . Minutes of Exercise per Session:   Stress:   . Feeling of Stress :   Social Connections:    . Frequency of Communication with Friends and Family:   . Frequency of Social Gatherings with Friends and Family:   . Attends Religious Services:   . Active Member of Clubs or Organizations:   . Attends Archivist Meetings:   Marland Kitchen Marital Status:    Outpatient Encounter Medications as of 11/17/2019  Medication Sig  . aspirin EC 81 MG tablet Take 81 mg by mouth daily.  . calcium carbonate (OS-CAL - DOSED IN MG OF ELEMENTAL CALCIUM) 1250 (500 Ca) MG tablet Take 1 tablet by mouth daily with breakfast.  . DULoxetine (CYMBALTA) 60 MG capsule Take 60 mg by mouth 2 (two) times daily.  Marland Kitchen levocetirizine (XYZAL) 5 MG tablet TK 1 T PO D UTD  . levothyroxine (SYNTHROID) 100 MCG tablet TAKE 1 TABLET(88 MCG) BY MOUTH DAILY  . metoprolol succinate (TOPROL-XL) 50 MG 24 hr tablet Take 50 mg by mouth daily.  Marland Kitchen omeprazole (PRILOSEC) 20 MG capsule daily.  . pravastatin (PRAVACHOL) 20 MG tablet Take 20 mg by mouth at bedtime.  . triamcinolone cream (KENALOG) 0.1 % Apply 1 application topically daily.  . vitamin C (ASCORBIC ACID) 500 MG tablet Take 500 mg by mouth  daily.   No facility-administered encounter medications on file as of 11/17/2019.   ALLERGIES: Allergies  Allergen Reactions  . Amoxicillin Nausea And Vomiting    Has patient had a PCN reaction causing immediate rash, facial/tongue/throat swelling, SOB or lightheadedness with hypotension: No Has patient had a PCN reaction causing severe rash involving mucus membranes or skin necrosis: No Has patient had a PCN reaction that required hospitalization No Has patient had a PCN reaction occurring within the last 10 years: No If all of the above answers are "NO", then may proceed with Cephalosporin use.     VACCINATION STATUS: Immunization History  Administered Date(s) Administered  . Influenza-Unspecified 05/28/2018    HPI 64 year old female patient with medical history as above.  She is returning complaining of foreign body sensation  in her throat.  She is being seen in this clinic for follow-up of longstanding hypothyroidism from Hashimoto's thyroiditis.  Her 2018 thyroid ultrasound was unremarkable.  -Her levothyroxine was recently adjusted to 100 mcg p.o. daily before breakfast. -She denies weight loss, palpitations, tremors, heat intolerance.  - She denies any history of goiter nor thyroid surgery. She denies any history of ablative thyroid therapy. - She has family history of hypothyroidism in one of her sisters as well as in her mother. - She has mostly been light weight most of her adult life.   Complains of dry mouth and throat.  She denies heat/cold intolerance. She is a chronic active smoker, currently being treated for bronchitis.  Review of Systems Limited as above.  Objective:    BP (!) 149/78   Pulse 61   Ht 5\' 5"  (1.651 m)   Wt 134 lb (60.8 kg)   BMI 22.30 kg/m   Wt Readings from Last 3 Encounters:  11/17/19 134 lb (60.8 kg)  02/24/19 135 lb (61.2 kg)  05/23/18 130 lb (59 kg)     Physical Exam- Limited  Constitutional:  Body mass index is 22.3 kg/m. , not in acute distress, normal state of mind Eyes:  EOMI, no exophthalmos Neck: Supple Thyroid: No gross goiter Respiratory: Adequate breathing efforts Musculoskeletal: no gross deformities, strength intact in all four extremities, no gross restriction of joint movements Skin:  no rashes, no hyperemia Neurological: no tremor with outstretched hands,    TSH from 08/18/2016 was 2.4. 08/14/2017: TSH 2.2, free T4 1. 3 Labs from February 13, 2018 showed TSH 0.55, free T4 1.4 9  September 23, 2018 labs TSH 0-7 1, free T4 1.53 February 20, 2019 total T4 9.3 Recent Results (from the past 2160 hour(s))  Basic metabolic panel     Status: None   Collection Time: 10/13/19 12:00 AM  Result Value Ref Range   BUN 10 4 - 21   Creatinine 0.8 0.5 - 1.1  Lipid panel     Status: Abnormal   Collection Time: 10/13/19 12:00 AM  Result Value Ref Range    Triglycerides 146 40 - 160   Cholesterol 231 (A) 0 - 200   HDL 53 35 - 70   LDL Cholesterol 152   TSH     Status: None   Collection Time: 10/13/19 12:00 AM  Result Value Ref Range   TSH 5.54 0.41 - 5.90  TSH     Status: Abnormal   Collection Time: 10/27/19 12:00 AM  Result Value Ref Range   TSH 6.32 (A) 0.41 - 5.90    Comment: free t4 1.1     Assessment & Plan:   1. Hypothyroidism 2. Hashimoto's  thyroiditis  -Based on her recent thyroid function tests which are suggestive of under replacement, her levothyroxine was adjusted to 100 mcg daily before breakfast.   - We discussed about the correct intake of her thyroid hormone, on empty stomach at fasting, with water, separated by at least 30 minutes from breakfast and other medications,  and separated by more than 4 hours from calcium, iron, multivitamins, acid reflux medications (PPIs). -Patient is made aware of the fact that thyroid hormone replacement is needed for life, dose to be adjusted by periodic monitoring of thyroid function tests.   - Her prior thyroid sonogram is unremarkable for nodules. This is consistent with heterogeneous texture from chronic thyroiditis/Hashimoto's thyroiditis.   -In light of her complaint of dysphagia, she will be offered repeat thyroid/neck ultrasound.   -This is likely due to xerostomia, advised to eat with liquids, as well as use of chewing gum on a regular basis to stimulate saliva secretion.   -If significant thyromegaly is ruled out, she may benefit from ENT evaluation.  - I advised patient to maintain close follow up with Hasanaj, Samul Dada, MD for primary care needs.     - Time spent on this patient care encounter:  25 minutes of which 50% was spent in  counseling and the rest reviewing  her current and  previous labs / studies and medications  doses and developing a plan for long term care. Carrie Adams  participated in the discussions, expressed understanding, and voiced agreement with the  above plans.  All questions were answered to her satisfaction. she is encouraged to contact clinic should she have any questions or concerns prior to her return visit.   Follow up plan: Return in about 9 weeks (around 01/19/2020) for Follow up with Pre-visit Labs, Thyroid / Neck Ultrasound.  Glade Lloyd, MD Phone: [phone]  Fax: [phone]   -  This note was partially dictated with voice recognition software. Similar sounding words can be transcribed inadequately or may not  be corrected upon review.  11/17/2019, 6:51 PM

## 2019-11-24 DIAGNOSIS — Z90710 Acquired absence of both cervix and uterus: Secondary | ICD-10-CM | POA: Diagnosis not present

## 2019-11-24 DIAGNOSIS — I714 Abdominal aortic aneurysm, without rupture: Secondary | ICD-10-CM | POA: Diagnosis not present

## 2019-11-26 DIAGNOSIS — M79605 Pain in left leg: Secondary | ICD-10-CM | POA: Diagnosis not present

## 2019-11-26 DIAGNOSIS — Z48812 Encounter for surgical aftercare following surgery on the circulatory system: Secondary | ICD-10-CM | POA: Diagnosis not present

## 2019-11-26 DIAGNOSIS — I714 Abdominal aortic aneurysm, without rupture: Secondary | ICD-10-CM | POA: Diagnosis not present

## 2019-11-26 DIAGNOSIS — M79604 Pain in right leg: Secondary | ICD-10-CM | POA: Diagnosis not present

## 2019-12-09 DIAGNOSIS — R05 Cough: Secondary | ICD-10-CM | POA: Diagnosis not present

## 2019-12-09 DIAGNOSIS — J0100 Acute maxillary sinusitis, unspecified: Secondary | ICD-10-CM | POA: Diagnosis not present

## 2019-12-16 DIAGNOSIS — I714 Abdominal aortic aneurysm, without rupture: Secondary | ICD-10-CM | POA: Diagnosis not present

## 2019-12-16 DIAGNOSIS — Z8673 Personal history of transient ischemic attack (TIA), and cerebral infarction without residual deficits: Secondary | ICD-10-CM | POA: Diagnosis not present

## 2019-12-16 DIAGNOSIS — Z79899 Other long term (current) drug therapy: Secondary | ICD-10-CM | POA: Diagnosis not present

## 2019-12-16 DIAGNOSIS — E7800 Pure hypercholesterolemia, unspecified: Secondary | ICD-10-CM | POA: Diagnosis not present

## 2019-12-16 DIAGNOSIS — R69 Illness, unspecified: Secondary | ICD-10-CM | POA: Diagnosis not present

## 2019-12-16 DIAGNOSIS — I10 Essential (primary) hypertension: Secondary | ICD-10-CM | POA: Diagnosis not present

## 2019-12-16 DIAGNOSIS — Z20822 Contact with and (suspected) exposure to covid-19: Secondary | ICD-10-CM | POA: Diagnosis not present

## 2019-12-16 DIAGNOSIS — K219 Gastro-esophageal reflux disease without esophagitis: Secondary | ICD-10-CM | POA: Diagnosis not present

## 2019-12-16 DIAGNOSIS — E039 Hypothyroidism, unspecified: Secondary | ICD-10-CM | POA: Diagnosis not present

## 2019-12-16 DIAGNOSIS — Z7982 Long term (current) use of aspirin: Secondary | ICD-10-CM | POA: Diagnosis not present

## 2019-12-19 DIAGNOSIS — E7800 Pure hypercholesterolemia, unspecified: Secondary | ICD-10-CM | POA: Diagnosis not present

## 2019-12-19 DIAGNOSIS — I714 Abdominal aortic aneurysm, without rupture: Secondary | ICD-10-CM | POA: Diagnosis not present

## 2019-12-19 DIAGNOSIS — E039 Hypothyroidism, unspecified: Secondary | ICD-10-CM | POA: Diagnosis not present

## 2019-12-19 DIAGNOSIS — E785 Hyperlipidemia, unspecified: Secondary | ICD-10-CM | POA: Diagnosis not present

## 2019-12-19 DIAGNOSIS — K219 Gastro-esophageal reflux disease without esophagitis: Secondary | ICD-10-CM | POA: Diagnosis not present

## 2019-12-19 DIAGNOSIS — R69 Illness, unspecified: Secondary | ICD-10-CM | POA: Diagnosis not present

## 2019-12-19 DIAGNOSIS — I10 Essential (primary) hypertension: Secondary | ICD-10-CM | POA: Diagnosis not present

## 2019-12-19 DIAGNOSIS — Z79899 Other long term (current) drug therapy: Secondary | ICD-10-CM | POA: Diagnosis not present

## 2019-12-19 DIAGNOSIS — Z7982 Long term (current) use of aspirin: Secondary | ICD-10-CM | POA: Diagnosis not present

## 2019-12-19 DIAGNOSIS — Z8673 Personal history of transient ischemic attack (TIA), and cerebral infarction without residual deficits: Secondary | ICD-10-CM | POA: Diagnosis not present

## 2019-12-19 DIAGNOSIS — Z20822 Contact with and (suspected) exposure to covid-19: Secondary | ICD-10-CM | POA: Diagnosis not present

## 2020-01-05 DIAGNOSIS — R69 Illness, unspecified: Secondary | ICD-10-CM | POA: Diagnosis not present

## 2020-01-05 DIAGNOSIS — I10 Essential (primary) hypertension: Secondary | ICD-10-CM | POA: Diagnosis not present

## 2020-01-05 DIAGNOSIS — M797 Fibromyalgia: Secondary | ICD-10-CM | POA: Diagnosis not present

## 2020-01-05 DIAGNOSIS — M545 Low back pain: Secondary | ICD-10-CM | POA: Diagnosis not present

## 2020-01-05 DIAGNOSIS — E038 Other specified hypothyroidism: Secondary | ICD-10-CM | POA: Diagnosis not present

## 2020-01-05 DIAGNOSIS — J449 Chronic obstructive pulmonary disease, unspecified: Secondary | ICD-10-CM | POA: Diagnosis not present

## 2020-01-05 DIAGNOSIS — Z6822 Body mass index (BMI) 22.0-22.9, adult: Secondary | ICD-10-CM | POA: Diagnosis not present

## 2020-01-13 ENCOUNTER — Telehealth: Payer: Self-pay | Admitting: "Endocrinology

## 2020-01-13 MED ORDER — LEVOTHYROXINE SODIUM 100 MCG PO TABS: ORAL_TABLET | ORAL | 0 refills | Status: DC

## 2020-01-13 NOTE — Telephone Encounter (Signed)
Pt needs refill on her Synthroid 129mcg. The script says take 69mcg. Please advise.

## 2020-01-13 NOTE — Telephone Encounter (Signed)
Rx for levothyroxine 152mcg sent.

## 2020-01-21 ENCOUNTER — Other Ambulatory Visit: Payer: Self-pay

## 2020-01-21 DIAGNOSIS — E063 Autoimmune thyroiditis: Secondary | ICD-10-CM

## 2020-01-21 DIAGNOSIS — E038 Other specified hypothyroidism: Secondary | ICD-10-CM

## 2020-01-21 MED ORDER — LEVOTHYROXINE SODIUM 100 MCG PO TABS: ORAL_TABLET | ORAL | 0 refills | Status: DC

## 2020-01-21 NOTE — Telephone Encounter (Signed)
Re-sent to pharmacy.

## 2020-01-21 NOTE — Telephone Encounter (Signed)
Pt is saying pharmacy never received this. Can you resend it?

## 2020-02-03 DIAGNOSIS — X32XXXA Exposure to sunlight, initial encounter: Secondary | ICD-10-CM | POA: Diagnosis not present

## 2020-02-03 DIAGNOSIS — Z85828 Personal history of other malignant neoplasm of skin: Secondary | ICD-10-CM | POA: Diagnosis not present

## 2020-02-03 DIAGNOSIS — C44629 Squamous cell carcinoma of skin of left upper limb, including shoulder: Secondary | ICD-10-CM | POA: Diagnosis not present

## 2020-02-03 DIAGNOSIS — L570 Actinic keratosis: Secondary | ICD-10-CM | POA: Diagnosis not present

## 2020-02-03 DIAGNOSIS — C44622 Squamous cell carcinoma of skin of right upper limb, including shoulder: Secondary | ICD-10-CM | POA: Diagnosis not present

## 2020-02-03 DIAGNOSIS — Z08 Encounter for follow-up examination after completed treatment for malignant neoplasm: Secondary | ICD-10-CM | POA: Diagnosis not present

## 2020-02-03 DIAGNOSIS — B078 Other viral warts: Secondary | ICD-10-CM | POA: Diagnosis not present

## 2020-02-19 ENCOUNTER — Other Ambulatory Visit: Payer: Self-pay

## 2020-02-19 ENCOUNTER — Ambulatory Visit (HOSPITAL_COMMUNITY)
Admission: RE | Admit: 2020-02-19 | Discharge: 2020-02-19 | Disposition: A | Discharge status: Home / Self Care | Payer: Medicare HMO | Source: Ambulatory Visit | Attending: "Endocrinology | Admitting: "Endocrinology

## 2020-02-19 DIAGNOSIS — E040 Nontoxic diffuse goiter: Secondary | ICD-10-CM

## 2020-02-19 DIAGNOSIS — E049 Nontoxic goiter, unspecified: Secondary | ICD-10-CM | POA: Insufficient documentation

## 2020-02-20 DIAGNOSIS — I714 Abdominal aortic aneurysm, without rupture: Secondary | ICD-10-CM | POA: Diagnosis not present

## 2020-02-20 DIAGNOSIS — K5790 Diverticulosis of intestine, part unspecified, without perforation or abscess without bleeding: Secondary | ICD-10-CM | POA: Diagnosis not present

## 2020-02-20 DIAGNOSIS — Z48812 Encounter for surgical aftercare following surgery on the circulatory system: Secondary | ICD-10-CM | POA: Diagnosis not present

## 2020-02-20 DIAGNOSIS — N2889 Other specified disorders of kidney and ureter: Secondary | ICD-10-CM | POA: Diagnosis not present

## 2020-02-23 DIAGNOSIS — E038 Other specified hypothyroidism: Secondary | ICD-10-CM | POA: Diagnosis not present

## 2020-02-23 DIAGNOSIS — E065 Other chronic thyroiditis: Secondary | ICD-10-CM | POA: Diagnosis not present

## 2020-02-23 LAB — TSH: TSH: 1.97 (ref 0.41–5.90)

## 2020-02-24 ENCOUNTER — Encounter: Payer: Self-pay | Admitting: "Endocrinology

## 2020-02-24 ENCOUNTER — Ambulatory Visit (INDEPENDENT_AMBULATORY_CARE_PROVIDER_SITE_OTHER): Payer: Medicare HMO | Admitting: "Endocrinology

## 2020-02-24 ENCOUNTER — Other Ambulatory Visit: Payer: Self-pay

## 2020-02-24 ENCOUNTER — Telehealth: Payer: Self-pay | Admitting: "Endocrinology

## 2020-02-24 VITALS — BP 178/83 | HR 66 | Ht 65.0 in | Wt 132.0 lb

## 2020-02-24 DIAGNOSIS — E038 Other specified hypothyroidism: Secondary | ICD-10-CM

## 2020-02-24 DIAGNOSIS — E063 Autoimmune thyroiditis: Secondary | ICD-10-CM | POA: Diagnosis not present

## 2020-02-24 MED ORDER — LEVOTHYROXINE SODIUM 100 MCG PO TABS: ORAL_TABLET | ORAL | 3 refills | Status: DC

## 2020-02-24 NOTE — Telephone Encounter (Signed)
Pt said she did her labs yesterday morning at [facility], can you see if the results are back, she is on the schedule today at 2pm.

## 2020-02-24 NOTE — Progress Notes (Signed)
02/24/2020  Endocrinology follow-up note   Subjective:    Patient ID: Carrie Adams, female    DOB: [birth year], PCP [name], MD   Past Medical History:  Diagnosis Date  . Anxiety   . Arthritis   . Hypothyroidism   . Stroke [facility])    several, last 2013, balance problems, memory loss occ  . Venous thrombosis    Past Surgical History:  Procedure Laterality Date  . ABDOMINAL HYSTERECTOMY     partial  . APPENDECTOMY     with hysterectomy  . COSMETIC SURGERY Bilateral    breast implants   . FEMUR IM NAIL  11/10/2011   Procedure: INTRAMEDULLARY (IM) NAIL FEMORAL;  Surgeon: [name], MD;  Location: [place];  Service: Orthopedics;  Laterality: Left;  . HARDWARE REMOVAL Left 04/06/2016   Procedure: HARDWARE REMOVAL LEFT HIP;  Surgeon: Rod Can, MD;  Location: [facility];  Service: Orthopedics;  Laterality: Left;   Social History   Socioeconomic History  . Marital status: Divorced    Spouse name: Not on file  . Number of children: Not on file  . Years of education: Not on file  . Highest education level: Not on file  Occupational History  . Not on file  Tobacco Use  . Smoking status: Current Every Day Smoker    Packs/day: 1.00    Years: 33.00    Pack years: 33.00    Types: Cigarettes  . Smokeless tobacco: Never Used  Vaping Use  . Vaping Use: Never used  Substance and Sexual Activity  . Alcohol use: No  . Drug use: No  . Sexual activity: Not on file  Other Topics Concern  . Not on file  Social History Narrative  . Not on file   Social Determinants of Health   Financial Resource Strain:   . Difficulty of Paying Living Expenses:   Food Insecurity:   . Worried About Charity fundraiser in the Last Year:   . Arboriculturist in the Last Year:   Transportation Needs:   . Film/video editor (Medical):   Marland Kitchen Lack of Transportation (Non-Medical):   Physical Activity:   . Days of Exercise per Week:   . Minutes of Exercise per Session:   Stress:   .  Feeling of Stress :   Social Connections:   . Frequency of Communication with Friends and Family:   . Frequency of Social Gatherings with Friends and Family:   . Attends Religious Services:   . Active Member of Clubs or Organizations:   . Attends Archivist Meetings:   Marland Kitchen Marital Status:    Outpatient Encounter Medications as of 02/24/2020  Medication Sig  . aspirin EC 81 MG tablet Take 81 mg by mouth daily.  . calcium carbonate (OS-CAL - DOSED IN MG OF ELEMENTAL CALCIUM) 1250 (500 Ca) MG tablet Take 1 tablet by mouth daily with breakfast.  . DULoxetine (CYMBALTA) 60 MG capsule Take 60 mg by mouth 2 (two) times daily.  Marland Kitchen levocetirizine (XYZAL) 5 MG tablet TK 1 T PO D UTD  . levothyroxine (SYNTHROID) 100 MCG tablet TAKE 1 TABLET(88 MCG) BY MOUTH DAILY  . metoprolol succinate (TOPROL-XL) 50 MG 24 hr tablet Take 50 mg by mouth daily.  Marland Kitchen omeprazole (PRILOSEC) 20 MG capsule daily.  . pravastatin (PRAVACHOL) 20 MG tablet Take 20 mg by mouth at bedtime.  . triamcinolone cream (KENALOG) 0.1 % Apply 1 application topically daily.  . vitamin C (ASCORBIC  ACID) 500 MG tablet Take 500 mg by mouth daily.  . [DISCONTINUED] levothyroxine (SYNTHROID) 100 MCG tablet TAKE 1 TABLET(88 MCG) BY MOUTH DAILY   No facility-administered encounter medications on file as of 02/24/2020.   ALLERGIES: Allergies  Allergen Reactions  . Amoxicillin Nausea And Vomiting    Has patient had a PCN reaction causing immediate rash, facial/tongue/throat swelling, SOB or lightheadedness with hypotension: No Has patient had a PCN reaction causing severe rash involving mucus membranes or skin necrosis: No Has patient had a PCN reaction that required hospitalization No Has patient had a PCN reaction occurring within the last 10 years: No If all of the above answers are "NO", then may proceed with Cephalosporin use.     VACCINATION STATUS: Immunization History  Administered Date(s) Administered  .  Influenza-Unspecified 05/28/2018    HPI 64 year old female patient with medical history as above.  She is returning complaining of foreign body sensation in her throat.  She is being seen in this clinic for follow-up of longstanding hypothyroidism from Hashimoto's thyroiditis.  Her 2018 thyroid ultrasound was unremarkable.  -Her levothyroxine was recently adjusted to 100 mcg p.o. daily before breakfast. -She denies weight loss, palpitations, tremors, heat intolerance.  - She denies any history of goiter nor thyroid surgery. She denies any history of ablative thyroid therapy. - She has family history of hypothyroidism in one of her sisters as well as in her mother. - She has mostly been light weight most of her adult life. She denies heat/cold intolerance. She is a chronic active smoker, currently being treated for bronchitis.  Review of Systems Limited as above.  Objective:    BP (!) 178/83   Pulse 66   Ht 5\' 5"  (1.651 m)   Wt 132 lb (59.9 kg)   BMI 21.97 kg/m   Wt Readings from Last 3 Encounters:  02/24/20 132 lb (59.9 kg)  11/17/19 134 lb (60.8 kg)  02/24/19 135 lb (61.2 kg)     Physical Exam- Limited  Constitutional:  Body mass index is 21.97 kg/m. , not in acute distress, normal state of mind Eyes:  EOMI, no exophthalmos Neck: Supple Thyroid: No gross goiter Respiratory: Adequate breathing efforts Musculoskeletal: no gross deformities, strength intact in all four extremities, no gross restriction of joint movements Skin:  no rashes, no hyperemia Neurological: no tremor with outstretched hands,    TSH from 08/18/2016 was 2.4. 08/14/2017: TSH 2.2, free T4 1. 3 Labs from February 13, 2018 showed TSH 0.55, free T4 1.4 9  September 23, 2018 labs TSH 0-7 1, free T4 1.53 February 20, 2019 total T4 9.3 Recent Results (from the past 2160 hour(s))  TSH     Status: None   Collection Time: 02/23/20 12:00 AM  Result Value Ref Range   TSH 1.97 0.41 - 5.90    Comment: Free T4=0.99      Assessment & Plan:   1. Hypothyroidism 2. Hashimoto's thyroiditis  -Her previsit thyroid function tests are consistent with appropriate replacement.  She is advised to continue levothyroxine 100 mcg p.o. daily before breakfast.     - We discussed about the correct intake of her thyroid hormone, on empty stomach at fasting, with water, separated by at least 30 minutes from breakfast and other medications,  and separated by more than 4 hours from calcium, iron, multivitamins, acid reflux medications (PPIs). -Patient is made aware of the fact that thyroid hormone replacement is needed for life, dose to be adjusted by periodic monitoring of thyroid function  tests.   - Her previsit thyroid ultrasound is consistent with heterogeneous thyroid parenchyma suggestive of chronic thyroiditis with no focal nodules.    This is consistent with heterogeneous texture from chronic thyroiditis/Hashimoto's thyroiditis.  She will not need intervention for now.   - I advised patient to maintain close follow up with [name], MD for primary care needs.     - Time spent on this patient care encounter:  20 minutes of which 50% was spent in  counseling and the rest reviewing  her current and  previous labs / studies and medications  doses and developing a plan for long term care. Carrie Adams  participated in the discussions, expressed understanding, and voiced agreement with the above plans.  All questions were answered to her satisfaction. she is encouraged to contact clinic should she have any questions or concerns prior to her return visit.  Follow up plan: Return in about 1 year (around 02/23/2021) for F/U with Pre-visit Labs.  Glade Lloyd, MD Phone: [phone]  Fax: [phone]   -  This note was partially dictated with voice recognition software. Similar sounding words can be transcribed inadequately or may not  be corrected upon review.  02/24/2020, 5:06 PM

## 2020-02-24 NOTE — Telephone Encounter (Signed)
They are faxing results over

## 2020-02-26 DIAGNOSIS — I10 Essential (primary) hypertension: Secondary | ICD-10-CM | POA: Diagnosis not present

## 2020-02-26 DIAGNOSIS — M545 Low back pain: Secondary | ICD-10-CM | POA: Diagnosis not present

## 2020-02-26 DIAGNOSIS — J449 Chronic obstructive pulmonary disease, unspecified: Secondary | ICD-10-CM | POA: Diagnosis not present

## 2020-02-26 DIAGNOSIS — Z6821 Body mass index (BMI) 21.0-21.9, adult: Secondary | ICD-10-CM | POA: Diagnosis not present

## 2020-03-30 DIAGNOSIS — I639 Cerebral infarction, unspecified: Secondary | ICD-10-CM | POA: Diagnosis not present

## 2020-03-30 DIAGNOSIS — E538 Deficiency of other specified B group vitamins: Secondary | ICD-10-CM | POA: Diagnosis not present

## 2020-03-30 DIAGNOSIS — R413 Other amnesia: Secondary | ICD-10-CM | POA: Diagnosis not present

## 2020-04-01 ENCOUNTER — Other Ambulatory Visit: Payer: Self-pay | Admitting: "Endocrinology

## 2020-04-09 DIAGNOSIS — R413 Other amnesia: Secondary | ICD-10-CM | POA: Diagnosis not present

## 2020-04-09 DIAGNOSIS — E538 Deficiency of other specified B group vitamins: Secondary | ICD-10-CM | POA: Diagnosis not present

## 2020-04-23 DIAGNOSIS — I639 Cerebral infarction, unspecified: Secondary | ICD-10-CM | POA: Diagnosis not present

## 2020-04-23 DIAGNOSIS — R413 Other amnesia: Secondary | ICD-10-CM | POA: Diagnosis not present

## 2020-04-23 DIAGNOSIS — Z8673 Personal history of transient ischemic attack (TIA), and cerebral infarction without residual deficits: Secondary | ICD-10-CM | POA: Diagnosis not present

## 2020-04-23 DIAGNOSIS — I6782 Cerebral ischemia: Secondary | ICD-10-CM | POA: Diagnosis not present

## 2020-04-23 DIAGNOSIS — R9082 White matter disease, unspecified: Secondary | ICD-10-CM | POA: Diagnosis not present

## 2020-04-26 DIAGNOSIS — R519 Headache, unspecified: Secondary | ICD-10-CM | POA: Diagnosis not present

## 2020-04-26 DIAGNOSIS — Z6821 Body mass index (BMI) 21.0-21.9, adult: Secondary | ICD-10-CM | POA: Diagnosis not present

## 2020-04-26 DIAGNOSIS — M545 Low back pain: Secondary | ICD-10-CM | POA: Diagnosis not present

## 2020-04-26 DIAGNOSIS — I10 Essential (primary) hypertension: Secondary | ICD-10-CM | POA: Diagnosis not present

## 2020-04-26 DIAGNOSIS — J449 Chronic obstructive pulmonary disease, unspecified: Secondary | ICD-10-CM | POA: Diagnosis not present

## 2020-05-04 DIAGNOSIS — I639 Cerebral infarction, unspecified: Secondary | ICD-10-CM | POA: Diagnosis not present

## 2020-05-04 DIAGNOSIS — G9389 Other specified disorders of brain: Secondary | ICD-10-CM | POA: Diagnosis not present

## 2020-05-04 DIAGNOSIS — R413 Other amnesia: Secondary | ICD-10-CM | POA: Diagnosis not present

## 2020-05-10 DIAGNOSIS — I10 Essential (primary) hypertension: Secondary | ICD-10-CM | POA: Diagnosis not present

## 2020-05-10 DIAGNOSIS — Z6821 Body mass index (BMI) 21.0-21.9, adult: Secondary | ICD-10-CM | POA: Diagnosis not present

## 2020-06-22 DIAGNOSIS — I6523 Occlusion and stenosis of bilateral carotid arteries: Secondary | ICD-10-CM | POA: Diagnosis not present

## 2020-06-22 DIAGNOSIS — Z48812 Encounter for surgical aftercare following surgery on the circulatory system: Secondary | ICD-10-CM | POA: Diagnosis not present

## 2020-08-03 IMAGING — US US THYROID
1 series · 13 of 25 positions shown · non-contrast
Comparison: 01/19/2017

CLINICAL DATA: Goiter.

EXAM:
THYROID ULTRASOUND
TECHNIQUE: Ultrasound examination of the thyroid gland and adjacent soft
tissues was performed.

[Series 1: us thyroid · 0.05mm/px · 13 of 59 slices shown]
[im 1/59]
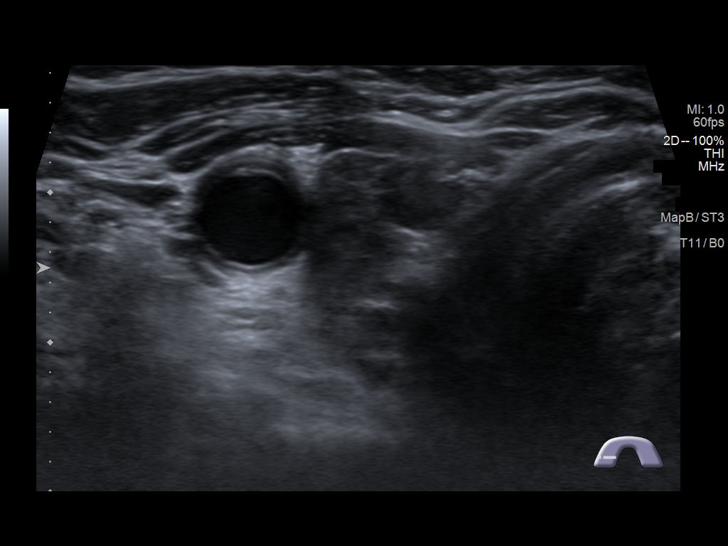
[im 5/59]
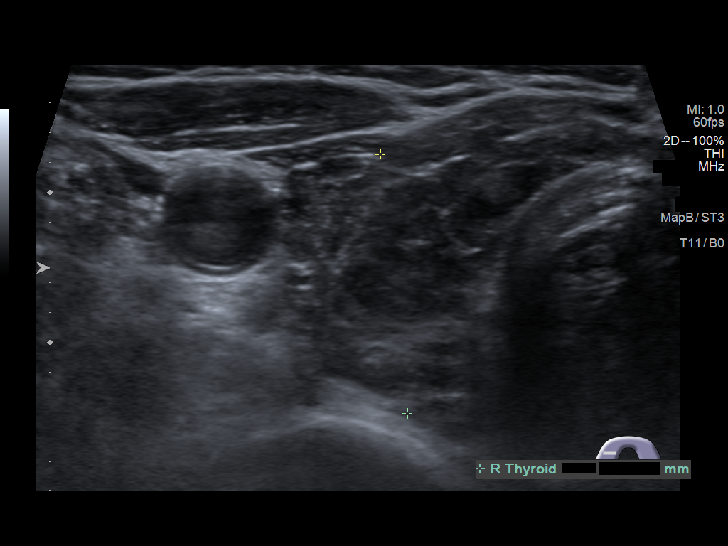
[im 10/59]
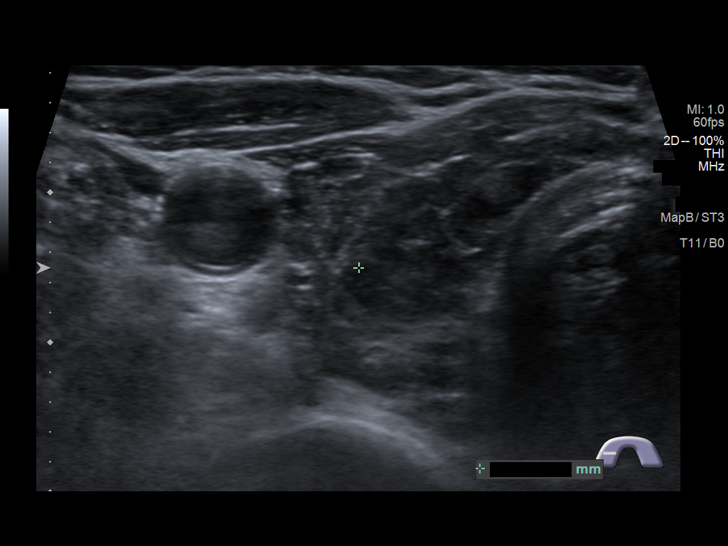
[im 15/59]
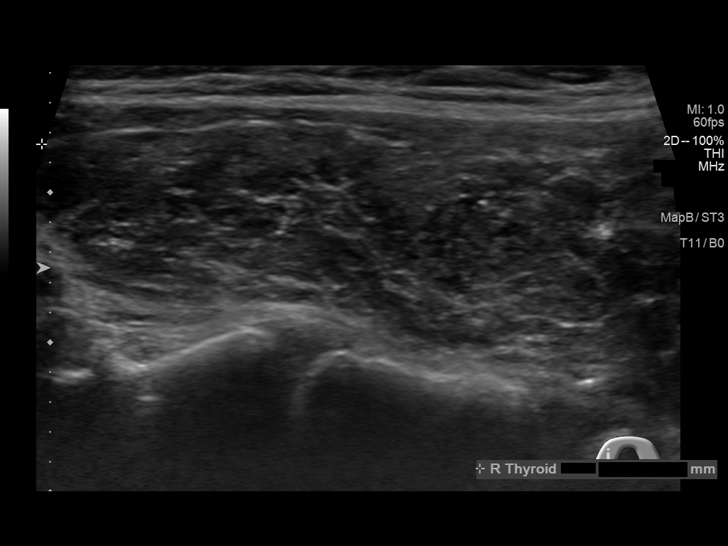
[im 20/59]
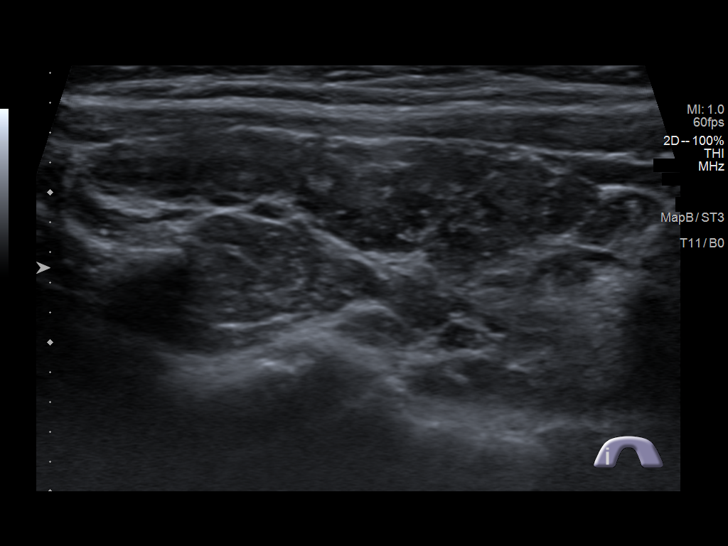
[im 25/59]
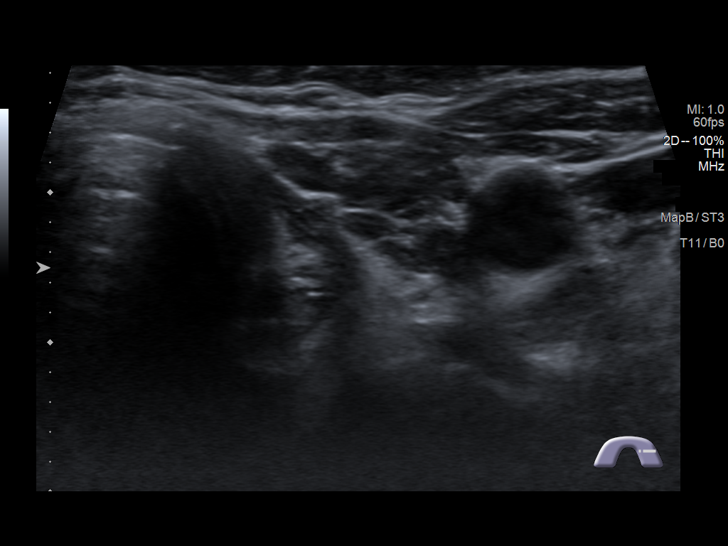
[im 30/59]
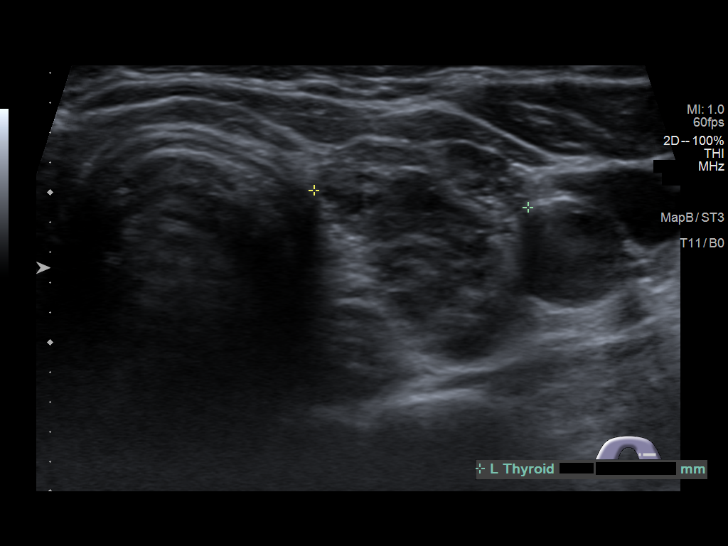
[im 34/59]
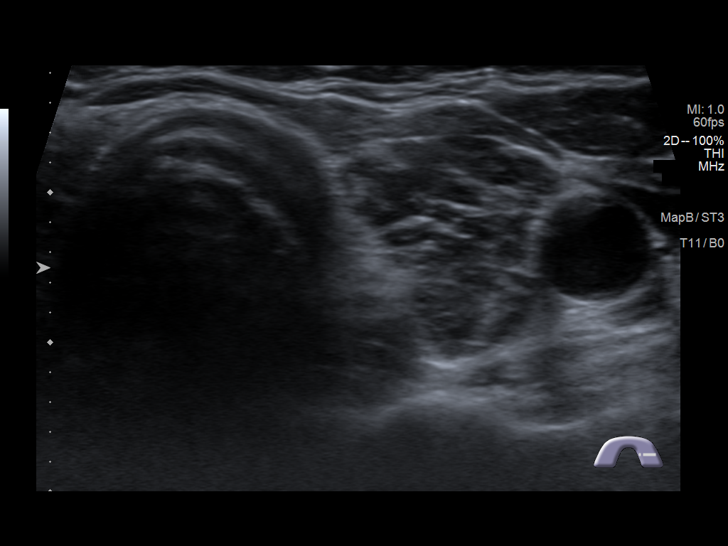
[im 39/59]
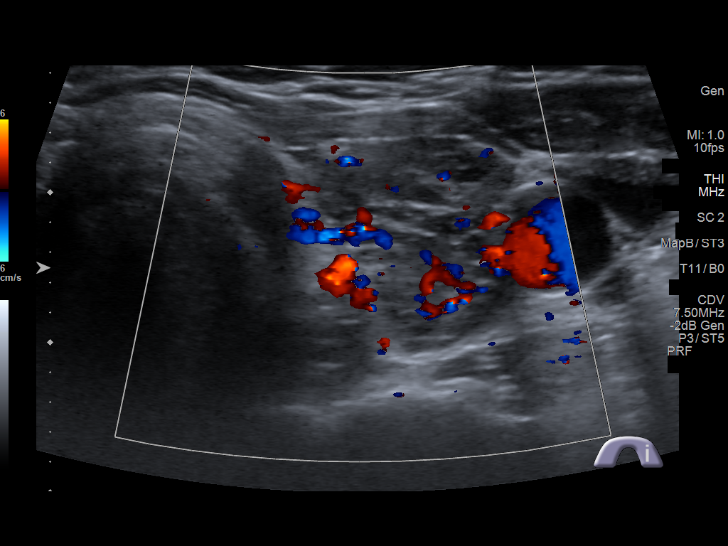
[im 44/59]
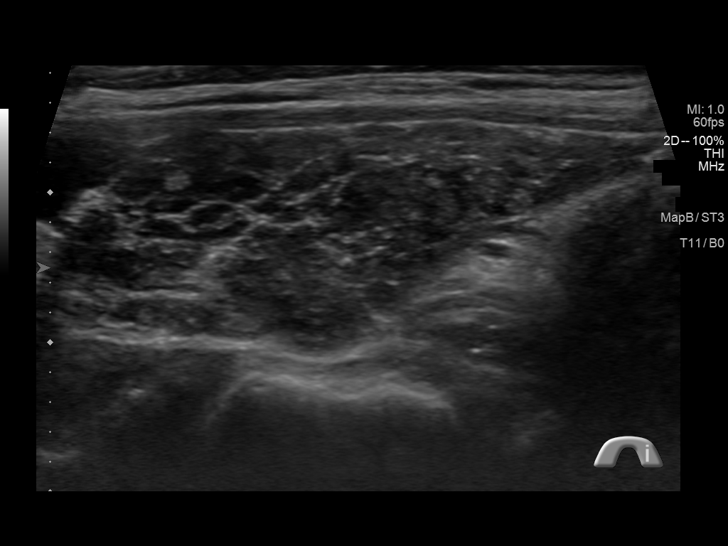
[im 49/59]
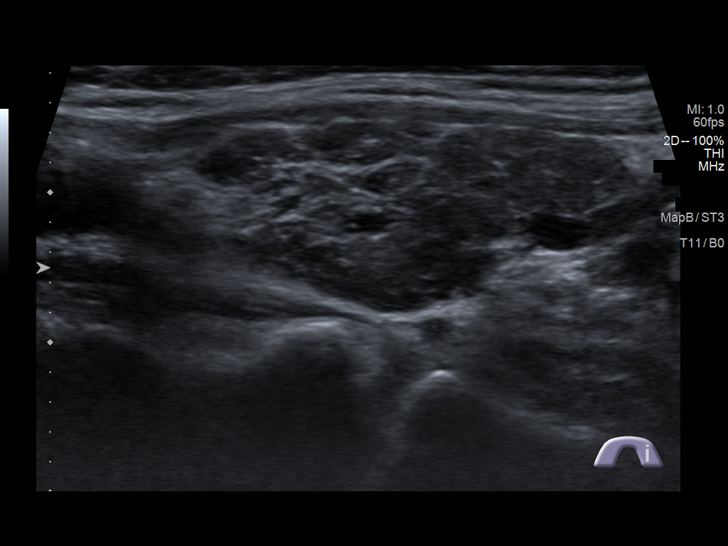
[im 54/59]
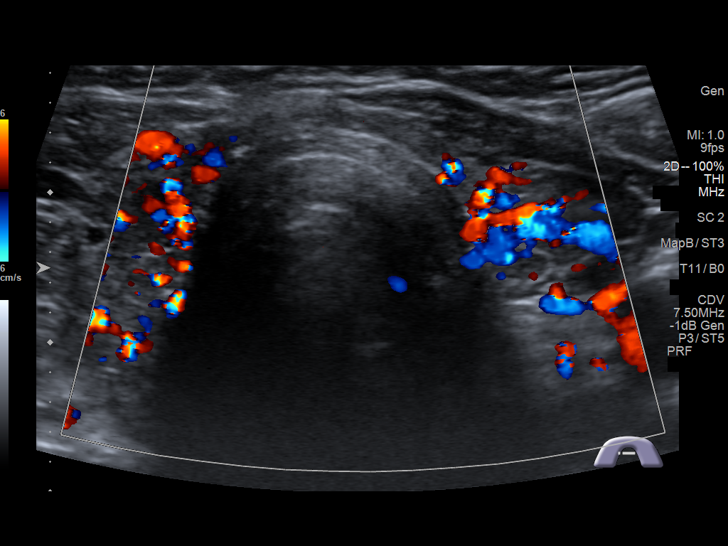
[im 59/59]
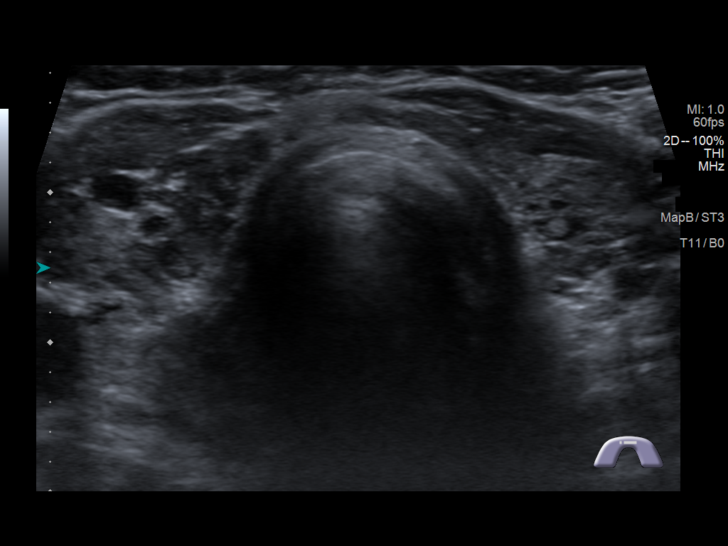

[13 of 25 positions shown; findings below may reference images not displayed]

FINDINGS: Parenchymal Echotexture: Moderately heterogenous

Isthmus: 0.2 cm

Right lobe: 4.3 x 1.6 x 1.4 cm

Left lobe: 4.2 x 1.6 x 1.4 cm

_________________________________________________________

Estimated total number of nodules >/= 1 cm: 0

Number of spongiform nodules >/=  2 cm not described below (TR1): 0

Number of mixed cystic and solid nodules >/= 1.5 cm not described
below (TR2): 0

_________________________________________________________

Similar to the prior study, the thyroid gland is diffusely
heterogeneous in appearance without focal discrete nodules.
Increased vascularity throughout the thyroid parenchyma is
subjectively slightly less prominent by color Doppler compared to
the prior study.
IMPRESSION: Similar heterogeneity of the thyroid parenchyma suggestive of
chronic thyroiditis. No focal nodules identified. Hypervascularity
of the thyroid parenchyma is subjectively slightly less prominent by
color Doppler compared to the prior study.

The above is in keeping with the ACR TI-RADS recommendations - [HOSPITAL] 4578;[DATE].

## 2020-08-25 DIAGNOSIS — R69 Illness, unspecified: Secondary | ICD-10-CM | POA: Diagnosis not present

## 2020-09-01 DIAGNOSIS — Z6822 Body mass index (BMI) 22.0-22.9, adult: Secondary | ICD-10-CM | POA: Diagnosis not present

## 2020-09-01 DIAGNOSIS — M545 Low back pain, unspecified: Secondary | ICD-10-CM | POA: Diagnosis not present

## 2020-09-01 DIAGNOSIS — R69 Illness, unspecified: Secondary | ICD-10-CM | POA: Diagnosis not present

## 2020-09-01 DIAGNOSIS — I10 Essential (primary) hypertension: Secondary | ICD-10-CM | POA: Diagnosis not present

## 2020-09-01 DIAGNOSIS — J449 Chronic obstructive pulmonary disease, unspecified: Secondary | ICD-10-CM | POA: Diagnosis not present

## 2020-09-17 DIAGNOSIS — J449 Chronic obstructive pulmonary disease, unspecified: Secondary | ICD-10-CM | POA: Diagnosis not present

## 2020-09-17 DIAGNOSIS — I739 Peripheral vascular disease, unspecified: Secondary | ICD-10-CM | POA: Diagnosis not present

## 2020-09-17 DIAGNOSIS — E785 Hyperlipidemia, unspecified: Secondary | ICD-10-CM | POA: Diagnosis not present

## 2020-09-17 DIAGNOSIS — I252 Old myocardial infarction: Secondary | ICD-10-CM | POA: Diagnosis not present

## 2020-09-17 DIAGNOSIS — I10 Essential (primary) hypertension: Secondary | ICD-10-CM | POA: Diagnosis not present

## 2020-09-17 DIAGNOSIS — I69334 Monoplegia of upper limb following cerebral infarction affecting left non-dominant side: Secondary | ICD-10-CM | POA: Diagnosis not present

## 2020-09-17 DIAGNOSIS — E039 Hypothyroidism, unspecified: Secondary | ICD-10-CM | POA: Diagnosis not present

## 2020-09-17 DIAGNOSIS — R69 Illness, unspecified: Secondary | ICD-10-CM | POA: Diagnosis not present

## 2020-10-04 ENCOUNTER — Other Ambulatory Visit: Payer: Self-pay

## 2020-10-04 DIAGNOSIS — E038 Other specified hypothyroidism: Secondary | ICD-10-CM

## 2020-10-04 DIAGNOSIS — E063 Autoimmune thyroiditis: Secondary | ICD-10-CM

## 2020-10-04 MED ORDER — LEVOTHYROXINE SODIUM 100 MCG PO TABS: 100.0000 ug | ORAL_TABLET | Freq: Every day | ORAL | 1 refills | Status: DC

## 2020-11-11 DIAGNOSIS — I739 Peripheral vascular disease, unspecified: Secondary | ICD-10-CM | POA: Diagnosis not present

## 2020-11-11 DIAGNOSIS — I70212 Atherosclerosis of native arteries of extremities with intermittent claudication, left leg: Secondary | ICD-10-CM | POA: Diagnosis not present

## 2020-11-17 DIAGNOSIS — I6522 Occlusion and stenosis of left carotid artery: Secondary | ICD-10-CM | POA: Diagnosis not present

## 2020-11-17 DIAGNOSIS — I714 Abdominal aortic aneurysm, without rupture: Secondary | ICD-10-CM | POA: Diagnosis not present

## 2020-11-17 DIAGNOSIS — I83813 Varicose veins of bilateral lower extremities with pain: Secondary | ICD-10-CM | POA: Diagnosis not present

## 2020-11-17 DIAGNOSIS — I70212 Atherosclerosis of native arteries of extremities with intermittent claudication, left leg: Secondary | ICD-10-CM | POA: Diagnosis not present

## 2020-11-17 DIAGNOSIS — I739 Peripheral vascular disease, unspecified: Secondary | ICD-10-CM | POA: Diagnosis not present

## 2020-11-17 DIAGNOSIS — Z48812 Encounter for surgical aftercare following surgery on the circulatory system: Secondary | ICD-10-CM | POA: Diagnosis not present

## 2020-11-25 DIAGNOSIS — Z880 Allergy status to penicillin: Secondary | ICD-10-CM | POA: Diagnosis not present

## 2020-11-25 DIAGNOSIS — I70213 Atherosclerosis of native arteries of extremities with intermittent claudication, bilateral legs: Secondary | ICD-10-CM | POA: Diagnosis not present

## 2020-11-25 DIAGNOSIS — N261 Atrophy of kidney (terminal): Secondary | ICD-10-CM | POA: Diagnosis not present

## 2020-11-25 DIAGNOSIS — E7800 Pure hypercholesterolemia, unspecified: Secondary | ICD-10-CM | POA: Diagnosis not present

## 2020-11-25 DIAGNOSIS — K219 Gastro-esophageal reflux disease without esophagitis: Secondary | ICD-10-CM | POA: Diagnosis not present

## 2020-11-25 DIAGNOSIS — I714 Abdominal aortic aneurysm, without rupture: Secondary | ICD-10-CM | POA: Diagnosis not present

## 2020-11-25 DIAGNOSIS — Z7982 Long term (current) use of aspirin: Secondary | ICD-10-CM | POA: Diagnosis not present

## 2020-11-25 DIAGNOSIS — I10 Essential (primary) hypertension: Secondary | ICD-10-CM | POA: Diagnosis not present

## 2020-11-25 DIAGNOSIS — Z79899 Other long term (current) drug therapy: Secondary | ICD-10-CM | POA: Diagnosis not present

## 2020-11-25 DIAGNOSIS — D3500 Benign neoplasm of unspecified adrenal gland: Secondary | ICD-10-CM | POA: Diagnosis not present

## 2020-11-25 DIAGNOSIS — E079 Disorder of thyroid, unspecified: Secondary | ICD-10-CM | POA: Diagnosis not present

## 2020-11-25 DIAGNOSIS — R69 Illness, unspecified: Secondary | ICD-10-CM | POA: Diagnosis not present

## 2020-11-25 DIAGNOSIS — I739 Peripheral vascular disease, unspecified: Secondary | ICD-10-CM | POA: Diagnosis not present

## 2020-11-25 DIAGNOSIS — M79662 Pain in left lower leg: Secondary | ICD-10-CM | POA: Diagnosis not present

## 2020-11-30 DIAGNOSIS — R69 Illness, unspecified: Secondary | ICD-10-CM | POA: Diagnosis not present

## 2020-11-30 DIAGNOSIS — Z1331 Encounter for screening for depression: Secondary | ICD-10-CM | POA: Diagnosis not present

## 2020-11-30 DIAGNOSIS — J449 Chronic obstructive pulmonary disease, unspecified: Secondary | ICD-10-CM | POA: Diagnosis not present

## 2020-11-30 DIAGNOSIS — M545 Low back pain, unspecified: Secondary | ICD-10-CM | POA: Diagnosis not present

## 2020-11-30 DIAGNOSIS — Z0000 Encounter for general adult medical examination without abnormal findings: Secondary | ICD-10-CM | POA: Diagnosis not present

## 2020-11-30 DIAGNOSIS — I10 Essential (primary) hypertension: Secondary | ICD-10-CM | POA: Diagnosis not present

## 2020-11-30 DIAGNOSIS — Z6822 Body mass index (BMI) 22.0-22.9, adult: Secondary | ICD-10-CM | POA: Diagnosis not present

## 2020-12-24 DIAGNOSIS — Z01810 Encounter for preprocedural cardiovascular examination: Secondary | ICD-10-CM | POA: Diagnosis not present

## 2020-12-24 DIAGNOSIS — I70212 Atherosclerosis of native arteries of extremities with intermittent claudication, left leg: Secondary | ICD-10-CM | POA: Diagnosis not present

## 2020-12-31 DIAGNOSIS — E063 Autoimmune thyroiditis: Secondary | ICD-10-CM | POA: Diagnosis not present

## 2020-12-31 DIAGNOSIS — E785 Hyperlipidemia, unspecified: Secondary | ICD-10-CM | POA: Diagnosis not present

## 2020-12-31 DIAGNOSIS — I771 Stricture of artery: Secondary | ICD-10-CM | POA: Diagnosis not present

## 2020-12-31 DIAGNOSIS — Z79899 Other long term (current) drug therapy: Secondary | ICD-10-CM | POA: Diagnosis not present

## 2020-12-31 DIAGNOSIS — M79605 Pain in left leg: Secondary | ICD-10-CM | POA: Diagnosis not present

## 2020-12-31 DIAGNOSIS — R69 Illness, unspecified: Secondary | ICD-10-CM | POA: Diagnosis not present

## 2020-12-31 DIAGNOSIS — I10 Essential (primary) hypertension: Secondary | ICD-10-CM | POA: Diagnosis not present

## 2020-12-31 DIAGNOSIS — E7800 Pure hypercholesterolemia, unspecified: Secondary | ICD-10-CM | POA: Diagnosis not present

## 2020-12-31 DIAGNOSIS — I739 Peripheral vascular disease, unspecified: Secondary | ICD-10-CM | POA: Diagnosis not present

## 2020-12-31 DIAGNOSIS — I69311 Memory deficit following cerebral infarction: Secondary | ICD-10-CM | POA: Diagnosis not present

## 2020-12-31 DIAGNOSIS — I7389 Other specified peripheral vascular diseases: Secondary | ICD-10-CM | POA: Diagnosis not present

## 2020-12-31 DIAGNOSIS — I70202 Unspecified atherosclerosis of native arteries of extremities, left leg: Secondary | ICD-10-CM | POA: Diagnosis not present

## 2020-12-31 DIAGNOSIS — I69354 Hemiplegia and hemiparesis following cerebral infarction affecting left non-dominant side: Secondary | ICD-10-CM | POA: Diagnosis not present

## 2020-12-31 DIAGNOSIS — K219 Gastro-esophageal reflux disease without esophagitis: Secondary | ICD-10-CM | POA: Diagnosis not present

## 2020-12-31 DIAGNOSIS — I70212 Atherosclerosis of native arteries of extremities with intermittent claudication, left leg: Secondary | ICD-10-CM | POA: Diagnosis not present

## 2020-12-31 DIAGNOSIS — E038 Other specified hypothyroidism: Secondary | ICD-10-CM | POA: Diagnosis not present

## 2020-12-31 DIAGNOSIS — Z716 Tobacco abuse counseling: Secondary | ICD-10-CM | POA: Diagnosis not present

## 2021-01-28 DIAGNOSIS — Z48812 Encounter for surgical aftercare following surgery on the circulatory system: Secondary | ICD-10-CM | POA: Diagnosis not present

## 2021-01-28 DIAGNOSIS — I739 Peripheral vascular disease, unspecified: Secondary | ICD-10-CM | POA: Diagnosis not present

## 2021-02-01 DIAGNOSIS — Z6822 Body mass index (BMI) 22.0-22.9, adult: Secondary | ICD-10-CM | POA: Diagnosis not present

## 2021-02-01 DIAGNOSIS — R5383 Other fatigue: Secondary | ICD-10-CM | POA: Diagnosis not present

## 2021-02-02 DIAGNOSIS — D649 Anemia, unspecified: Secondary | ICD-10-CM | POA: Diagnosis not present

## 2021-02-02 DIAGNOSIS — R5383 Other fatigue: Secondary | ICD-10-CM | POA: Diagnosis not present

## 2021-02-03 DIAGNOSIS — D649 Anemia, unspecified: Secondary | ICD-10-CM | POA: Diagnosis not present

## 2021-02-08 DIAGNOSIS — Z08 Encounter for follow-up examination after completed treatment for malignant neoplasm: Secondary | ICD-10-CM | POA: Diagnosis not present

## 2021-02-08 DIAGNOSIS — Z85828 Personal history of other malignant neoplasm of skin: Secondary | ICD-10-CM | POA: Diagnosis not present

## 2021-02-08 DIAGNOSIS — C44629 Squamous cell carcinoma of skin of left upper limb, including shoulder: Secondary | ICD-10-CM | POA: Diagnosis not present

## 2021-02-11 ENCOUNTER — Encounter (HOSPITAL_COMMUNITY): Payer: Self-pay

## 2021-02-11 ENCOUNTER — Other Ambulatory Visit: Payer: Self-pay

## 2021-02-11 ENCOUNTER — Emergency Department (HOSPITAL_COMMUNITY)
Admission: EM | Admit: 2021-02-11 | Discharge: 2021-02-11 | Disposition: A | Discharge status: Home / Self Care | Payer: Medicare HMO | Attending: Emergency Medicine | Admitting: Emergency Medicine

## 2021-02-11 DIAGNOSIS — L7622 Postprocedural hemorrhage and hematoma of skin and subcutaneous tissue following other procedure: Secondary | ICD-10-CM | POA: Diagnosis not present

## 2021-02-11 DIAGNOSIS — F17210 Nicotine dependence, cigarettes, uncomplicated: Secondary | ICD-10-CM | POA: Insufficient documentation

## 2021-02-11 DIAGNOSIS — E038 Other specified hypothyroidism: Secondary | ICD-10-CM | POA: Diagnosis not present

## 2021-02-11 DIAGNOSIS — E039 Hypothyroidism, unspecified: Secondary | ICD-10-CM | POA: Diagnosis not present

## 2021-02-11 DIAGNOSIS — Z7982 Long term (current) use of aspirin: Secondary | ICD-10-CM | POA: Insufficient documentation

## 2021-02-11 DIAGNOSIS — R69 Illness, unspecified: Secondary | ICD-10-CM | POA: Diagnosis not present

## 2021-02-11 DIAGNOSIS — Z79899 Other long term (current) drug therapy: Secondary | ICD-10-CM | POA: Diagnosis not present

## 2021-02-11 DIAGNOSIS — Z4800 Encounter for change or removal of nonsurgical wound dressing: Secondary | ICD-10-CM | POA: Diagnosis not present

## 2021-02-11 DIAGNOSIS — E063 Autoimmune thyroiditis: Secondary | ICD-10-CM | POA: Diagnosis not present

## 2021-02-11 DIAGNOSIS — Z5189 Encounter for other specified aftercare: Secondary | ICD-10-CM

## 2021-02-11 DIAGNOSIS — E065 Other chronic thyroiditis: Secondary | ICD-10-CM | POA: Diagnosis not present

## 2021-02-11 DIAGNOSIS — Z4801 Encounter for change or removal of surgical wound dressing: Secondary | ICD-10-CM | POA: Diagnosis not present

## 2021-02-11 HISTORY — DX: Peripheral vascular disease, unspecified: I73.9

## 2021-02-11 LAB — I-STAT CHEM 8, ED
BUN: 15 mg/dL (ref 8–23)
Calcium, Ion: 1.16 mmol/L (ref 1.15–1.40)
Chloride: 102 mmol/L (ref 98–111)
Creatinine, Ser: 0.6 mg/dL (ref 0.44–1.00)
Glucose, Bld: 102 mg/dL — ABNORMAL HIGH (ref 70–99)
HCT: 27 % — ABNORMAL LOW (ref 36.0–46.0)
Hemoglobin: 9.2 g/dL — ABNORMAL LOW (ref 12.0–15.0)
Potassium: 3.3 mmol/L — ABNORMAL LOW (ref 3.5–5.1)
Sodium: 139 mmol/L (ref 135–145)
TCO2: 25 mmol/L (ref 22–32)

## 2021-02-11 LAB — TSH: TSH: 3.34 (ref 0.41–5.90)

## 2021-02-11 NOTE — ED Provider Notes (Signed)
[facility] EMERGENCY DEPARTMENT Provider Note   CSN: [id number] Arrival date & time: 02/11/21  1401     History Chief Complaint  Patient presents with   Wound Check    Carrie Adams is a 65 y.o. female.  HPI  Patient with significant medical history of anxiety, PAD, arthritis, stroke, recent left femoral stents presents with complaint of bleeding from a incision site.  Patient states on Tuesday she saw her dermatologist who removed a possible malignant skin lesion from her left arm.  Patient states it did not bleed at that time, she states she has been cleaning it without difficulty, she states suddenly today it  started to bleed and she was unable to control it.  She denies paresthesia or weakness in her lower arm, she denies increased redness around the area,worsening pain drainage or discharge.  Patient states she has never had bleeding like this in the past.  She denies recent trauma to the area, she does endorse that she is taking Plavix as well as aspirin due to her recent femoral stent, she denies any history of bleeding disorders, she denies nosebleeds, abnormal bruising, hematemesis, hematuria, hematochezia.  Patient has no other complaints at this time.  Patient denies fevers, chills, chest pain, shortness breath, abdominal pain.  Past Medical History:  Diagnosis Date   Anxiety    Arthritis    Hypothyroidism    PAD (peripheral artery disease) ([place])    Stroke ([place])    several, last 2013, balance problems, memory loss occ   Venous thrombosis     Patient Active Problem List   Diagnosis Date Noted   Goiter diffuse 11/17/2019   Hashimoto's thyroiditis 01/23/2017   Hypothyroidism 12/27/2016   Painful orthopaedic hardware ([place]) 04/06/2016    Past Surgical History:  Procedure Laterality Date   ABDOMINAL HYSTERECTOMY     partial   APPENDECTOMY     with hysterectomy   COSMETIC SURGERY Bilateral    breast implants    FEMUR IM NAIL  11/10/2011   Procedure:  INTRAMEDULLARY (IM) NAIL FEMORAL;  Surgeon: [name], MD;  Location: [place];  Service: Orthopedics;  Laterality: Left;   HARDWARE REMOVAL Left 04/06/2016   Procedure: HARDWARE REMOVAL LEFT HIP;  Surgeon: Rod Can, MD;  Location: [facility];  Service: Orthopedics;  Laterality: Left;     OB History   No obstetric history on file.     No family history on file.  Social History   Tobacco Use   Smoking status: Every Day    Packs/day: 1.00    Years: 33.00    Pack years: 33.00    Types: Cigarettes   Smokeless tobacco: Never  Vaping Use   Vaping Use: Never used  Substance Use Topics   Alcohol use: No   Drug use: No    Home Medications Prior to Admission medications   Medication Sig Start Date End Date Taking? Authorizing Provider  aspirin EC 81 MG tablet Take 81 mg by mouth daily.    [provider]  calcium carbonate (OS-CAL - DOSED IN MG OF ELEMENTAL CALCIUM) 1250 (500 Ca) MG tablet Take 1 tablet by mouth daily with breakfast.    [provider]  DULoxetine (CYMBALTA) 60 MG capsule Take 60 mg by mouth 2 (two) times daily. 03/23/16   [provider]  levocetirizine (XYZAL) 5 MG tablet TK 1 T PO D UTD 05/09/18   [provider]  levothyroxine (SYNTHROID) 100 MCG tablet Take 1 tablet (100 mcg total)  by mouth daily before breakfast. 10/04/20   Cassandria Anger, MD  metoprolol succinate (TOPROL-XL) 50 MG 24 hr tablet Take 50 mg by mouth daily. 06/27/17   [provider]  omeprazole (PRILOSEC) 20 MG capsule daily. 02/20/19   [provider]  pravastatin (PRAVACHOL) 20 MG tablet Take 20 mg by mouth at bedtime. 02/16/16   [provider]  triamcinolone cream (KENALOG) 0.1 % Apply 1 application topically daily. 01/26/16   [provider]  vitamin C (ASCORBIC ACID) 500 MG tablet Take 500 mg by mouth daily.    [provider]    Allergies    Amoxicillin  Review of Systems   Review of Systems   Constitutional:  Negative for chills and fever.  HENT:  Negative for congestion.   Respiratory:  Negative for shortness of breath.   Cardiovascular:  Negative for chest pain.  Gastrointestinal:  Negative for abdominal pain.  Genitourinary:  Negative for enuresis.  Musculoskeletal:  Negative for back pain.  Skin:  Positive for wound. Negative for rash.       Bleeding from left arm.  Neurological:  Negative for dizziness.  Hematological:  Does not bruise/bleed easily.   Physical Exam Updated Vital Signs BP 99/63 (BP Location: Right Arm)   Pulse 74   Temp (!) 97 F (36.1 C) (Temporal)   Resp 18   Ht 5\' 5"  (1.651 m)   Wt 61.7 kg   SpO2 96%   BMI 22.63 kg/m   Physical Exam Vitals and nursing note reviewed.  Constitutional:      General: She is not in acute distress.    Appearance: She is not ill-appearing.  HENT:     Head: Normocephalic and atraumatic.     Nose: No congestion.  Eyes:     Conjunctiva/sclera: Conjunctivae normal.  Cardiovascular:     Rate and Rhythm: Normal rate and regular rhythm.     Pulses: Normal pulses.     Heart sounds: No murmur heard.   No friction rub. No gallop.  Pulmonary:     Effort: No respiratory distress.     Breath sounds: No wheezing, rhonchi or rales.  Skin:    General: Skin is warm and dry.     Comments: Patient's left arm was visualized she has a penny size circular incision on her left anterior aspect of her deltoid.  It was slightly oozing on my exam, no note of a arterial bleed, no surrounding erythema or edema, no drainage or discharge present.  Patient had full range of motion in her left fingers, wrist, elbow, shoulder, neurovascular fully intact.  Neurological:     Mental Status: She is alert.  Psychiatric:        Mood and Affect: Mood normal.    ED Results / Procedures / Treatments   Labs (all labs ordered are listed, but only abnormal results are displayed) Labs Reviewed  I-STAT CHEM 8, ED - Abnormal; Notable for the  following components:      Result Value   Potassium 3.3 (*)    Glucose, Bld 102 (*)    Hemoglobin 9.2 (*)    HCT 27.0 (*)    All other components within normal limits    EKG None  Radiology No results found.  Procedures Procedures   Medications Ordered in ED Medications - No data to display  ED Course  I have reviewed the triage vital signs and the nursing notes.  Pertinent labs & imaging results that were available during my  care of the patient were reviewed by me and considered in my medical decision making (see chart for details).    MDM Rules/Calculators/A&P                         Initial impression-patient presents with concerns of bleeding incision.  She is alert, does not appear in acute stress, vital signs reassuring.  Suspect she has increased bleeding due to antiplatelet medications.  Will obtain i-STAT Chem-8 to check on hemoglobin.  Work-up-Chem-8 reveals hypokalemia of 3.3, hyperglycemia 102, hemoglobin 9.2.  Reassessment-patient was reassessed after wound was rebandaged, has not bled through, she has no other complaints this time.  Vital signs remained stable.  Patient is agreeable for discharge.  Rule out-low suspicion for systemic infection as patient is nontoxic-appearing, vital signs reassuring.  Low suspicion for cellulitis and/or deep tissue infection as there is no surrounding erythema, no drainage discharge present, no fluctuant indurations present.  Low suspicion for acute bleeding disorder as patient denies recent nosebleeds, abnormal bruising, hematemesis, hematuria, hematochezia.  Low suspicion patient will require emergent blood transfusion as hemoglobin is at 9.2 and she is nonsymptomatic at this time.  Plan-  Wound check-suspect slightly abnormal bleeding secondary due to antiplatelet medications.  will have patient keep pressure on the area and have her follow-up with dermatology for further evaluation.  Vital signs have remained stable, no  indication for hospital admission.  Patient discussed with attending and they agreed with assessment and plan.  Patient given at home care as well strict return precautions.  Patient verbalized that they understood agreed to said plan.  Final Clinical Impression(s) / ED Diagnoses Final diagnoses:  Visit for wound check    Rx / DC Orders ED Discharge Orders     None        Marcello Fennel, PA-C 02/11/21 1534    Milton Ferguson, MD 02/14/21 1006

## 2021-02-11 NOTE — ED Notes (Addendum)
Bandage and pressure dressing applied per PA request, education provided for home dressing applications; pt verbalized understanding

## 2021-02-11 NOTE — Discharge Instructions (Addendum)
I suspect your bleeding is from the antiplatelet medications you are taking for your stent in your leg.  If wound starts to bleed again please rewrap the wound the way nursing staff showed you. be Sure to apply ample amount of pressure to the area and do not take the dressing off for 24 hours   Would like you to follow-up with dermatology for further evaluation, please call their office to let them know about the bleeding.  Come back to the emergency department if you develop chest pain, shortness of breath, severe abdominal pain, uncontrolled nausea, vomiting, diarrhea.

## 2021-02-11 NOTE — ED Triage Notes (Signed)
Pt presents to ED with bleeding to left shoulder from mole removal on Tuesday. Pt states just started bleeding while she was sitting there. Pressure dressing applied.

## 2021-02-22 ENCOUNTER — Encounter: Payer: Self-pay | Admitting: "Endocrinology

## 2021-02-22 ENCOUNTER — Other Ambulatory Visit: Payer: Self-pay

## 2021-02-22 ENCOUNTER — Ambulatory Visit: Payer: Medicare HMO | Admitting: "Endocrinology

## 2021-02-22 VITALS — BP 114/68 | HR 64 | Ht 65.0 in | Wt 136.6 lb

## 2021-02-22 DIAGNOSIS — E038 Other specified hypothyroidism: Secondary | ICD-10-CM | POA: Diagnosis not present

## 2021-02-22 DIAGNOSIS — E063 Autoimmune thyroiditis: Secondary | ICD-10-CM | POA: Diagnosis not present

## 2021-02-22 NOTE — Progress Notes (Signed)
02/22/2021  Endocrinology follow-up note   Subjective:    Patient ID: Carrie Adams, female    DOB: [birth year], PCP Carrie Burly, MD   Past Medical History:  Diagnosis Date   Anxiety    Arthritis    Hypothyroidism    PAD (peripheral artery disease) ([place])    Stroke ([place])    several, last 2013, balance problems, memory loss occ   Venous thrombosis    Past Surgical History:  Procedure Laterality Date   ABDOMINAL HYSTERECTOMY     partial   APPENDECTOMY     with hysterectomy   COSMETIC SURGERY Bilateral    breast implants    FEMUR IM NAIL  11/10/2011   Procedure: INTRAMEDULLARY (IM) NAIL FEMORAL;  Surgeon: [name], MD;  Location: [facility];  Service: Orthopedics;  Laterality: Left;   HARDWARE REMOVAL Left 04/06/2016   Procedure: HARDWARE REMOVAL LEFT HIP;  Surgeon: Rod Can, MD;  Location: [facility];  Service: Orthopedics;  Laterality: Left;   Social History   Socioeconomic History   Marital status: Divorced    Spouse name: Not on file   Number of children: Not on file   Years of education: Not on file   Highest education level: Not on file  Occupational History   Not on file  Tobacco Use   Smoking status: Every Day    Packs/day: 1.00    Years: 33.00    Pack years: 33.00    Types: Cigarettes   Smokeless tobacco: Never  Vaping Use   Vaping Use: Never used  Substance and Sexual Activity   Alcohol use: No   Drug use: No   Sexual activity: Not on file  Other Topics Concern   Not on file  Social History Narrative   Not on file   Social Determinants of Health   Financial Resource Strain: Not on file  Food Insecurity: Not on file  Transportation Needs: Not on file  Physical Activity: Not on file  Stress: Not on file  Social Connections: Not on file   Outpatient Encounter Medications as of 02/22/2021  Medication Sig   albuterol (VENTOLIN HFA) 108 (90 Base) MCG/ACT inhaler Inhale into the lungs.   amLODipine (NORVASC) 5 MG tablet Take 1 tablet by mouth  daily.   mupirocin ointment (BACTROBAN) 2 % as needed.   nystatin cream (MYCOSTATIN) Apply topically.   tretinoin (RETIN-A) 0.01 % gel as needed.   ALLERGY RELIEF 180 MG tablet Take 1 tablet by mouth daily.   aspirin EC 81 MG tablet Take 81 mg by mouth daily.   calcium carbonate (OS-CAL - DOSED IN MG OF ELEMENTAL CALCIUM) 1250 (500 Ca) MG tablet Take 1 tablet by mouth daily with breakfast.   chlorthalidone (HYGROTON) 25 MG tablet Take 25 mg by mouth daily.   clopidogrel (PLAVIX) 75 MG tablet Take 75 mg by mouth daily.   Cyanocobalamin (VITAMIN B-12 PO) Take 1 tablet by mouth daily.   DULoxetine (CYMBALTA) 60 MG capsule Take 60 mg by mouth 2 (two) times daily.   levothyroxine (SYNTHROID) 100 MCG tablet Take 1 tablet (100 mcg total) by mouth daily before breakfast.   metoprolol succinate (TOPROL-XL) 50 MG 24 hr tablet Take 50 mg by mouth 2 (two) times daily.   Omega-3 Fatty Acids (FISH OIL) 1000 MG CAPS Take 1 capsule by mouth daily.   omeprazole (PRILOSEC) 20 MG capsule daily.   pravastatin (PRAVACHOL) 20 MG tablet Take 20 mg by mouth at bedtime.   triamcinolone cream (KENALOG)  0.1 % Apply 1 application topically daily.   vitamin C (ASCORBIC ACID) 500 MG tablet Take 500 mg by mouth daily.   [DISCONTINUED] levocetirizine (XYZAL) 5 MG tablet TK 1 T PO D UTD   No facility-administered encounter medications on file as of 02/22/2021.   ALLERGIES: Allergies  Allergen Reactions   Amoxicillin Nausea And Vomiting    Has patient had a PCN reaction causing immediate rash, facial/tongue/throat swelling, SOB or lightheadedness with hypotension: No Has patient had a PCN reaction causing severe rash involving mucus membranes or skin necrosis: No Has patient had a PCN reaction that required hospitalization No Has patient had a PCN reaction occurring within the last 10 years: No If all of the above answers are "NO", then may proceed with Cephalosporin use.     VACCINATION STATUS: Immunization History   Administered Date(s) Administered   Influenza-Unspecified 05/28/2018    HPI 65 year old female patient with medical history as above.  She is returning for follow-up in the management of her longstanding hypothyroidism from Hashimoto's thyroiditis.    She is currently on a stable dose of levothyroxine 100 mcg p.o. daily before breakfast.  She reports compliance and consistency taking her medication.  She has no new complaints today.    Her 2018 thyroid ultrasound was unremarkable.  Repeat ultrasound on February 19, 2020 showed similar heterogeneity of thyroid parenchyma suggestive of chronic thyroiditis.  No focal nodules or hypervascularity. -She presents with 4 pound weight gain, denies palpitations, tremors, nor heat intolerance.  - She denies any history of goiter nor thyroid surgery. She denies any history of ablative thyroid therapy. - She has family history of hypothyroidism in one of her sisters as well as in her mother. - She has mostly been light weight most of her adult life. She denies heat/cold intolerance. She is a chronic active smoker, currently being treated for bronchitis.  Review of Systems Limited as above.  Objective:    BP 114/68   Pulse 64   Ht 5\' 5"  (1.651 m)   Wt 136 lb 9.6 oz (62 kg)   BMI 22.73 kg/m   Wt Readings from Last 3 Encounters:  02/22/21 136 lb 9.6 oz (62 kg)  02/11/21 136 lb (61.7 kg)  02/24/20 132 lb (59.9 kg)     Physical Exam- Limited  Constitutional:  Body mass index is 22.73 kg/m. , not in acute distress,   TSH from 08/18/2016 was 2.4. 08/14/2017: TSH 2.2, free T4 1. 3 Labs from February 13, 2018 showed TSH 0.55, free T4 1.4 9  September 23, 2018 labs TSH 0-7 1, free T4 1.53 February 20, 2019 total T4 9.3 Recent Results (from the past 2160 hour(s))  TSH     Status: None   Collection Time: 02/11/21 12:00 AM  Result Value Ref Range   TSH 3.34 0.41 - 5.90    Comment: FREE T4 1.16  I-stat chem 8, ED (not at [facility] or [facility])     Status: Abnormal    Collection Time: 02/11/21  3:01 PM  Result Value Ref Range   Sodium 139 135 - 145 mmol/L   Potassium 3.3 (L) 3.5 - 5.1 mmol/L   Chloride 102 98 - 111 mmol/L   BUN 15 8 - 23 mg/dL   Creatinine, Ser 0.60 0.44 - 1.00 mg/dL   Glucose, Bld 102 (H) 70 - 99 mg/dL    Comment: Glucose reference range applies only to samples taken after fasting for at least 8 hours.   Calcium, Ion 1.16 1.15 -  1.40 mmol/L   TCO2 25 22 - 32 mmol/L   Hemoglobin 9.2 (L) 12.0 - 15.0 g/dL   HCT 27.0 (L) 36.0 - 46.0 %     Assessment & Plan:   1. Hypothyroidism 2. Hashimoto's thyroiditis  -Her previsit thyroid function tests are consistent with appropriate replacement.  She is advised to continue levothyroxine 100 mcg p.o. daily before breakfast.     - We discussed about the correct intake of her thyroid hormone, on empty stomach at fasting, with water, separated by at least 30 minutes from breakfast and other medications,  and separated by more than 4 hours from calcium, iron, multivitamins, acid reflux medications (PPIs). -Patient is made aware of the fact that thyroid hormone replacement is needed for life, dose to be adjusted by periodic monitoring of thyroid function tests.   - Her recent  thyroid ultrasound is consistent with heterogeneous thyroid parenchyma suggestive of chronic thyroiditis with no focal nodules.    This is consistent with heterogeneous texture from chronic thyroiditis/Hashimoto's thyroiditis.  She will not need intervention for now.   - I advised patient to maintain close follow up with Carrie Burly, MD for primary care needs.   I spent 21 minutes in the care of the patient today including review of labs from Thyroid Function, CMP, and other relevant labs ; imaging/biopsy records (current and previous including abstractions from other facilities); face-to-face time discussing  her lab results and symptoms, medications doses, her options of short and long term treatment based on the  latest standards of care / guidelines;   and documenting the encounter.  Carrie Adams  participated in the discussions, expressed understanding, and voiced agreement with the above plans.  All questions were answered to her satisfaction. she is encouraged to contact clinic should she have any questions or concerns prior to her return visit.   Follow up plan: Return in about 6 months (around 08/24/2021) for F/U with Pre-visit Labs.  Glade Lloyd, MD Phone: [phone]  Fax: [phone]   -  This note was partially dictated with voice recognition software. Similar sounding words can be transcribed inadequately or may not  be corrected upon review.  02/22/2021, 2:49 PM

## 2021-02-24 ENCOUNTER — Ambulatory Visit: Payer: Medicare HMO | Admitting: "Endocrinology

## 2021-03-08 ENCOUNTER — Encounter (INDEPENDENT_AMBULATORY_CARE_PROVIDER_SITE_OTHER): Payer: Self-pay | Admitting: *Deleted

## 2021-03-08 DIAGNOSIS — R5383 Other fatigue: Secondary | ICD-10-CM | POA: Diagnosis not present

## 2021-03-08 DIAGNOSIS — D508 Other iron deficiency anemias: Secondary | ICD-10-CM | POA: Diagnosis not present

## 2021-03-08 DIAGNOSIS — D649 Anemia, unspecified: Secondary | ICD-10-CM | POA: Diagnosis not present

## 2021-03-08 DIAGNOSIS — Z79899 Other long term (current) drug therapy: Secondary | ICD-10-CM | POA: Diagnosis not present

## 2021-03-10 DIAGNOSIS — D649 Anemia, unspecified: Secondary | ICD-10-CM | POA: Diagnosis not present

## 2021-03-11 DIAGNOSIS — D649 Anemia, unspecified: Secondary | ICD-10-CM | POA: Diagnosis not present

## 2021-03-15 DIAGNOSIS — J449 Chronic obstructive pulmonary disease, unspecified: Secondary | ICD-10-CM | POA: Diagnosis not present

## 2021-03-15 DIAGNOSIS — I10 Essential (primary) hypertension: Secondary | ICD-10-CM | POA: Diagnosis not present

## 2021-03-15 DIAGNOSIS — R5383 Other fatigue: Secondary | ICD-10-CM | POA: Diagnosis not present

## 2021-03-15 DIAGNOSIS — D508 Other iron deficiency anemias: Secondary | ICD-10-CM | POA: Diagnosis not present

## 2021-03-15 DIAGNOSIS — R69 Illness, unspecified: Secondary | ICD-10-CM | POA: Diagnosis not present

## 2021-03-15 DIAGNOSIS — Z6822 Body mass index (BMI) 22.0-22.9, adult: Secondary | ICD-10-CM | POA: Diagnosis not present

## 2021-05-03 DIAGNOSIS — R5383 Other fatigue: Secondary | ICD-10-CM | POA: Diagnosis not present

## 2021-05-03 DIAGNOSIS — Z6822 Body mass index (BMI) 22.0-22.9, adult: Secondary | ICD-10-CM | POA: Diagnosis not present

## 2021-05-03 DIAGNOSIS — J449 Chronic obstructive pulmonary disease, unspecified: Secondary | ICD-10-CM | POA: Diagnosis not present

## 2021-05-03 DIAGNOSIS — D508 Other iron deficiency anemias: Secondary | ICD-10-CM | POA: Diagnosis not present

## 2021-06-13 ENCOUNTER — Other Ambulatory Visit: Payer: Self-pay

## 2021-06-13 ENCOUNTER — Encounter (INDEPENDENT_AMBULATORY_CARE_PROVIDER_SITE_OTHER): Payer: Self-pay

## 2021-06-13 ENCOUNTER — Ambulatory Visit (INDEPENDENT_AMBULATORY_CARE_PROVIDER_SITE_OTHER): Payer: Medicare HMO | Admitting: Gastroenterology

## 2021-06-13 ENCOUNTER — Telehealth (INDEPENDENT_AMBULATORY_CARE_PROVIDER_SITE_OTHER): Payer: Self-pay

## 2021-06-13 ENCOUNTER — Encounter (INDEPENDENT_AMBULATORY_CARE_PROVIDER_SITE_OTHER): Payer: Self-pay | Admitting: Gastroenterology

## 2021-06-13 DIAGNOSIS — D500 Iron deficiency anemia secondary to blood loss (chronic): Secondary | ICD-10-CM

## 2021-06-13 DIAGNOSIS — D509 Iron deficiency anemia, unspecified: Secondary | ICD-10-CM | POA: Insufficient documentation

## 2021-06-13 MED ORDER — PEG 3350-KCL-NA BICARB-NACL 420 G PO SOLR: 4000.0000 mL | ORAL | 0 refills | Status: DC

## 2021-06-13 NOTE — H&P (View-Only) (Signed)
Carrie Adams, M.D. Gastroenterology & Hepatology [facility] [address] [place], [place] [address] Primary Care Physician: [name], MD [place] [address]  Referring MD: PCP  Chief Complaint: Anemia  History of Present Illness: Carrie Adams is a 65 y.o. female with PMH GERD, peripheral artery disease status post placement of stents, AAA status post endovascular repair, anxiety, hypothyroidism, stroke, who presents for evaluation of iron deficiency anemia.  Patient reports that a few weeks after she had placement of 3 stents in her left lower extremity, she felt very fatigued and tired, along with shortness of breath and nausea. She had a left femoral endarterectomy with left iliac and SFA angioplasty on 12/31/2020 , she was placed on Plavix since then. Due to these symptoms she was seen by her PCP who found her to be severely anemic. She has felt these symptoms of fatigue since her procedure was performed.Upon review of her medical record available in care everywhere, she was noted to have a drop of her hemoglobin down to 7.0 on 02/02/2021 (baseline of 14.1 on 10/31/2015).  She received 2 unit of PRBC for this.  She had a repeat testing on 03/10/2021 which showed persistent anemia with hemoglobin of 7.0 and MCV of 82.  Other cell counts show white blood cell count of 7.5 and platelet of 458.  She received a three units of PRBC at that time as well.  No recent iron studies are available, she had a normal vitamin [id number] and folic acid of 18 on 5/88/5027.  She has been taking ferrous sulfate compliantly three times a day. Has noticed her stool became black and she has been constipated. She was taking Sennakot three times a day and OTC stool softener without significant improvement, so she is not taking any medication. She has not moved her bowels in two weeks. She used to have chronic constipation before starting her iron,  but used to move her bwoels once a week when she took the [place].  She has also noticed vomiting episodes possibly once a week. She taked dramamine for nausea and vomiting.  She reports her hemoglobin has been better upon recent recheck but no report is available.  The patient denies having any fever, chills,  hematemesis, abdominal distention, abdominal pain, diarrhea, jaundice, pruritus or weight loss.  Takes omeprazole 20 mg qday which control her heartburn effectively.  Notably, she had a AAA endosvascular repair on 11/29/2019.  Last XAJ:OINOM Last Colonoscopy:5-6 years ago, reports she had some polyps that were removed, no report is available - performed by Dr. Ladona Horns, was advised to have a repeat on 201 but had to postpone  FHx: neg for any gastrointestinal/liver disease, sister cervix cancer Social: smoking 1/2 pack a day, neg alcohol or illicit drug use Surgical: hysterectomy  Past Medical History: Past Medical History:  Diagnosis Date   Anxiety    Arthritis    Hypothyroidism    PAD (peripheral artery disease) ([place])    Stroke ([place])    several, last 2013, balance problems, memory loss occ   Venous thrombosis     Past Surgical History: Past Surgical History:  Procedure Laterality Date   ABDOMINAL HYSTERECTOMY     partial   APPENDECTOMY     with hysterectomy   COSMETIC SURGERY Bilateral    breast implants    FEMUR IM NAIL  11/10/2011   Procedure: INTRAMEDULLARY (IM) NAIL FEMORAL;  Surgeon: [name], MD;  Location:  [facility];  Service: Orthopedics;  Laterality: Left;   HARDWARE REMOVAL Left 04/06/2016   Procedure: HARDWARE REMOVAL LEFT HIP;  Surgeon: Rod Can, MD;  Location: [facility];  Service: Orthopedics;  Laterality: Left;    Family History: Family History  Problem Relation Age of Onset   Lung cancer Father     Social History: Social History   Tobacco Use  Smoking Status Every Day   Packs/day: 0.50   Years: 33.00   Pack years: 16.50   Types:  Cigarettes  Smokeless Tobacco Never   Social History   Substance and Sexual Activity  Alcohol Use No   Social History   Substance and Sexual Activity  Drug Use No    Allergies: Allergies  Allergen Reactions   Amoxicillin Nausea And Vomiting    Has patient had a PCN reaction causing immediate rash, facial/tongue/throat swelling, SOB or lightheadedness with hypotension: No Has patient had a PCN reaction causing severe rash involving mucus membranes or skin necrosis: No Has patient had a PCN reaction that required hospitalization No Has patient had a PCN reaction occurring within the last 10 years: No If all of the above answers are "NO", then may proceed with Cephalosporin use.     Medications: Current Outpatient Medications  Medication Sig Dispense Refill   albuterol (VENTOLIN HFA) 108 (90 Base) MCG/ACT inhaler Inhale 1 puff into the lungs every 4 (four) hours as needed.     ALLERGY RELIEF 180 MG tablet Take 1 tablet by mouth daily.     amLODipine (NORVASC) 5 MG tablet Take 1 tablet by mouth daily.     aspirin EC 81 MG tablet Take 81 mg by mouth daily.     calcium carbonate (OS-CAL - DOSED IN MG OF ELEMENTAL CALCIUM) 1250 (500 Ca) MG tablet Take 1 tablet by mouth daily with breakfast.     chlorthalidone (HYGROTON) 25 MG tablet Take 25 mg by mouth daily.     clopidogrel (PLAVIX) 75 MG tablet Take 75 mg by mouth daily.     Cyanocobalamin (VITAMIN B-12 PO) Take 1 tablet by mouth daily.     DULoxetine (CYMBALTA) 60 MG capsule Take 60 mg by mouth 2 (two) times daily.  0   ferrous sulfate 325 (65 FE) MG tablet Take 325 mg by mouth 3 (three) times daily with meals.     levothyroxine (SYNTHROID) 100 MCG tablet Take 1 tablet (100 mcg total) by mouth daily before breakfast. 90 tablet 1   metoprolol succinate (TOPROL-XL) 50 MG 24 hr tablet Take 50 mg by mouth 2 (two) times daily.  0   mupirocin ointment (BACTROBAN) 2 % as needed.     nystatin cream (MYCOSTATIN) Apply topically as  needed.     Omega-3 Fatty Acids (FISH OIL) 1000 MG CAPS Take 1 capsule by mouth daily.     omeprazole (PRILOSEC) 20 MG capsule Take 20 mg by mouth daily.     OVER THE COUNTER MEDICATION Vit D one po Qd     pravastatin (PRAVACHOL) 20 MG tablet Take 20 mg by mouth at bedtime.  0   tretinoin (RETIN-A) 0.01 % gel as needed.     triamcinolone cream (KENALOG) 0.1 % Apply 1 application topically daily.  0   vitamin C (ASCORBIC ACID) 500 MG tablet Take 500 mg by mouth daily.     polyethylene glycol-electrolytes (TRILYTE) 420 g solution Take 4,000 mLs by mouth as directed. 4000 mL 0   No current facility-administered medications for this visit.  Review of Systems: GENERAL: negative for malaise, night sweats HEENT: No changes in hearing or vision, no nose bleeds or other nasal problems. NECK: Negative for lumps, goiter, pain and significant neck swelling RESPIRATORY: Negative for cough, wheezing CARDIOVASCULAR: Negative for chest pain, leg swelling, palpitations, orthopnea GI: SEE HPI MUSCULOSKELETAL: Negative for joint pain or swelling, back pain, and muscle pain. SKIN: Negative for lesions, rash PSYCH: Negative for sleep disturbance, mood disorder and recent psychosocial stressors. HEMATOLOGY Negative for prolonged bleeding, bruising easily, and swollen nodes. ENDOCRINE: Negative for cold or heat intolerance, polyuria, polydipsia and goiter. NEURO: negative for tremor, gait imbalance, syncope and seizures. The remainder of the review of systems is noncontributory.   Physical Exam: BP (!) 112/52 (BP Location: Left Arm, Patient Position: Sitting, Cuff Size: Large)   Pulse 78   Temp 98.5 F (36.9 C) (Oral)   Ht 5\' 5"  (1.651 m)   Wt 135 lb 9.6 oz (61.5 kg)   BMI 22.57 kg/m  GENERAL: The patient is AO x3, in no acute distress. HEENT: Head is normocephalic and atraumatic. EOMI are intact. Mouth is well hydrated and without lesions. NECK: Supple. No masses LUNGS: Clear to auscultation. No  presence of rhonchi/wheezing/rales. Adequate chest expansion HEART: RRR, normal s1 and s2. ABDOMEN: Soft, nontender, no guarding, no peritoneal signs, and nondistended. BS +. No masses. EXTREMITIES: Without any cyanosis, clubbing, rash, lesions or edema. NEUROLOGIC: AOx3, no focal motor deficit. SKIN: no jaundice, no rashes   Imaging/Labs: as above  I personally reviewed and interpreted the available labs, imaging and endoscopic files.  Impression and Plan: Carrie Adams is a 65 y.o. female with PMH GERD, peripheral artery disease status post placement of stents, AAA status post endovascular repair, anxiety, hypothyroidism, stroke, who presents for evaluation of iron deficiency anemia.  Has presented severe iron deficiency anemia requiring transfusion of 5 units of PRBC in the last 6 months.  She has not presented any overt episodes of gastrointestinal bleeding but it is likely she has presented chronic losses from gastrointestinal lesions as she presented symptomatic anemia since she was started on Plavix.  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  We will need to explore this further with an EGD and colonoscopy, she understood and agreed.  For now, she will benefit from continuing taking the current iron supplementation dosage, we will recheck the CBC and iron stores today.  Finally, she has a history of chronic constipation that has worsened with intake of iron.  I advised her to start taking MiraLAX and uptitrate as needed to have more regular bowel movements.  - Check CBC and iron studies - Schedule EGD and colonoscopy - Continue oral iron supplementation - Start taking Miralax 1 capful every 12 hours. If after two weeks there is no improvement, increase to 1 capful every 8 hours  All questions were answered.      Carrie Peppers, MD Gastroenterology and  Hepatology [facility]

## 2021-06-13 NOTE — Progress Notes (Signed)
Carrie Adams, M.D. Gastroenterology & Hepatology [facility] [address] [place], [place] [address] Primary Care Physician: [name], MD [place] [address]  Referring MD: PCP  Chief Complaint: Anemia  History of Present Illness: Carrie Adams is a 65 y.o. female with PMH GERD, peripheral artery disease status post placement of stents, AAA status post endovascular repair, anxiety, hypothyroidism, stroke, who presents for evaluation of iron deficiency anemia.  Patient reports that a few weeks after she had placement of 3 stents in her left lower extremity, she felt very fatigued and tired, along with shortness of breath and nausea. She had a left femoral endarterectomy with left iliac and SFA angioplasty on 12/31/2020 , she was placed on Plavix since then. Due to these symptoms she was seen by her PCP who found her to be severely anemic. She has felt these symptoms of fatigue since her procedure was performed.Upon review of her medical record available in care everywhere, she was noted to have a drop of her hemoglobin down to 7.0 on 02/02/2021 (baseline of 14.1 on 10/31/2015).  She received 2 unit of PRBC for this.  She had a repeat testing on 03/10/2021 which showed persistent anemia with hemoglobin of 7.0 and MCV of 82.  Other cell counts show white blood cell count of 7.5 and platelet of 458.  She received a three units of PRBC at that time as well.  No recent iron studies are available, she had a normal vitamin I14 431 and folic acid of 18 on 5/40/0867.  She has been taking ferrous sulfate compliantly three times a day. Has noticed her stool became black and she has been constipated. She was taking Sennakot three times a day and OTC stool softener without significant improvement, so she is not taking any medication. She has not moved her bowels in two weeks. She used to have chronic constipation before starting her iron,  but used to move her bwoels once a week when she took the [employer].  She has also noticed vomiting episodes possibly once a week. She taked dramamine for nausea and vomiting.  She reports her hemoglobin has been better upon recent recheck but no report is available.  The patient denies having any fever, chills,  hematemesis, abdominal distention, abdominal pain, diarrhea, jaundice, pruritus or weight loss.  Takes omeprazole 20 mg qday which control her heartburn effectively.  Notably, she had a AAA endosvascular repair on 11/29/2019.  Last YPP:JKDTO Last Colonoscopy:5-6 years ago, reports she had some polyps that were removed, no report is available - performed by Dr. Ladona Horns, was advised to have a repeat on 201 but had to postpone  FHx: neg for any gastrointestinal/liver disease, sister cervix cancer Social: smoking 1/2 pack a day, neg alcohol or illicit drug use Surgical: hysterectomy  Past Medical History: Past Medical History:  Diagnosis Date   Anxiety    Arthritis    Hypothyroidism    PAD (peripheral artery disease) ([place])    Stroke ([place])    several, last 2013, balance problems, memory loss occ   Venous thrombosis     Past Surgical History: Past Surgical History:  Procedure Laterality Date   ABDOMINAL HYSTERECTOMY     partial   APPENDECTOMY     with hysterectomy   COSMETIC SURGERY Bilateral    breast implants    FEMUR IM NAIL  11/10/2011   Procedure: INTRAMEDULLARY (IM) NAIL FEMORAL;  Surgeon: [name], MD;  Location:  [facility];  Service: Orthopedics;  Laterality: Left;   HARDWARE REMOVAL Left 04/06/2016   Procedure: HARDWARE REMOVAL LEFT HIP;  Surgeon: Rod Can, MD;  Location: [facility];  Service: Orthopedics;  Laterality: Left;    Family History: Family History  Problem Relation Age of Onset   Lung cancer Father     Social History: Social History   Tobacco Use  Smoking Status Every Day   Packs/day: 0.50   Years: 33.00   Pack years: 16.50   Types:  Cigarettes  Smokeless Tobacco Never   Social History   Substance and Sexual Activity  Alcohol Use No   Social History   Substance and Sexual Activity  Drug Use No    Allergies: Allergies  Allergen Reactions   Amoxicillin Nausea And Vomiting    Has patient had a PCN reaction causing immediate rash, facial/tongue/throat swelling, SOB or lightheadedness with hypotension: No Has patient had a PCN reaction causing severe rash involving mucus membranes or skin necrosis: No Has patient had a PCN reaction that required hospitalization No Has patient had a PCN reaction occurring within the last 10 years: No If all of the above answers are "NO", then may proceed with Cephalosporin use.     Medications: Current Outpatient Medications  Medication Sig Dispense Refill   albuterol (VENTOLIN HFA) 108 (90 Base) MCG/ACT inhaler Inhale 1 puff into the lungs every 4 (four) hours as needed.     ALLERGY RELIEF 180 MG tablet Take 1 tablet by mouth daily.     amLODipine (NORVASC) 5 MG tablet Take 1 tablet by mouth daily.     aspirin EC 81 MG tablet Take 81 mg by mouth daily.     calcium carbonate (OS-CAL - DOSED IN MG OF ELEMENTAL CALCIUM) 1250 (500 Ca) MG tablet Take 1 tablet by mouth daily with breakfast.     chlorthalidone (HYGROTON) 25 MG tablet Take 25 mg by mouth daily.     clopidogrel (PLAVIX) 75 MG tablet Take 75 mg by mouth daily.     Cyanocobalamin (VITAMIN B-12 PO) Take 1 tablet by mouth daily.     DULoxetine (CYMBALTA) 60 MG capsule Take 60 mg by mouth 2 (two) times daily.  0   ferrous sulfate 325 (65 FE) MG tablet Take 325 mg by mouth 3 (three) times daily with meals.     levothyroxine (SYNTHROID) 100 MCG tablet Take 1 tablet (100 mcg total) by mouth daily before breakfast. 90 tablet 1   metoprolol succinate (TOPROL-XL) 50 MG 24 hr tablet Take 50 mg by mouth 2 (two) times daily.  0   mupirocin ointment (BACTROBAN) 2 % as needed.     nystatin cream (MYCOSTATIN) Apply topically as  needed.     Omega-3 Fatty Acids (FISH OIL) 1000 MG CAPS Take 1 capsule by mouth daily.     omeprazole (PRILOSEC) 20 MG capsule Take 20 mg by mouth daily.     OVER THE COUNTER MEDICATION Vit D one po Qd     pravastatin (PRAVACHOL) 20 MG tablet Take 20 mg by mouth at bedtime.  0   tretinoin (RETIN-A) 0.01 % gel as needed.     triamcinolone cream (KENALOG) 0.1 % Apply 1 application topically daily.  0   vitamin C (ASCORBIC ACID) 500 MG tablet Take 500 mg by mouth daily.     polyethylene glycol-electrolytes (TRILYTE) 420 g solution Take 4,000 mLs by mouth as directed. 4000 mL 0   No current facility-administered medications for this visit.  Review of Systems: GENERAL: negative for malaise, night sweats HEENT: No changes in hearing or vision, no nose bleeds or other nasal problems. NECK: Negative for lumps, goiter, pain and significant neck swelling RESPIRATORY: Negative for cough, wheezing CARDIOVASCULAR: Negative for chest pain, leg swelling, palpitations, orthopnea GI: SEE HPI MUSCULOSKELETAL: Negative for joint pain or swelling, back pain, and muscle pain. SKIN: Negative for lesions, rash PSYCH: Negative for sleep disturbance, mood disorder and recent psychosocial stressors. HEMATOLOGY Negative for prolonged bleeding, bruising easily, and swollen nodes. ENDOCRINE: Negative for cold or heat intolerance, polyuria, polydipsia and goiter. NEURO: negative for tremor, gait imbalance, syncope and seizures. The remainder of the review of systems is noncontributory.   Physical Exam: BP (!) 112/52 (BP Location: Left Arm, Patient Position: Sitting, Cuff Size: Large)   Pulse 78   Temp 98.5 F (36.9 C) (Oral)   Ht 5\' 5"  (1.651 m)   Wt 135 lb 9.6 oz (61.5 kg)   BMI 22.57 kg/m  GENERAL: The patient is AO x3, in no acute distress. HEENT: Head is normocephalic and atraumatic. EOMI are intact. Mouth is well hydrated and without lesions. NECK: Supple. No masses LUNGS: Clear to auscultation. No  presence of rhonchi/wheezing/rales. Adequate chest expansion HEART: RRR, normal s1 and s2. ABDOMEN: Soft, nontender, no guarding, no peritoneal signs, and nondistended. BS +. No masses. EXTREMITIES: Without any cyanosis, clubbing, rash, lesions or edema. NEUROLOGIC: AOx3, no focal motor deficit. SKIN: no jaundice, no rashes   Imaging/Labs: as above  I personally reviewed and interpreted the available labs, imaging and endoscopic files.  Impression and Plan: Carrie Adams is a 65 y.o. female with PMH GERD, peripheral artery disease status post placement of stents, AAA status post endovascular repair, anxiety, hypothyroidism, stroke, who presents for evaluation of iron deficiency anemia.  Has presented severe iron deficiency anemia requiring transfusion of 5 units of PRBC in the last 6 months.  She has not presented any overt episodes of gastrointestinal bleeding but it is likely she has presented chronic losses from gastrointestinal lesions as she presented symptomatic anemia since she was started on Plavix.  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  We will need to explore this further with an EGD and colonoscopy, she understood and agreed.  For now, she will benefit from continuing taking the current iron supplementation dosage, we will recheck the CBC and iron stores today.  Finally, she has a history of chronic constipation that has worsened with intake of iron.  I advised her to start taking MiraLAX and uptitrate as needed to have more regular bowel movements.  - Check CBC and iron studies - Schedule EGD and colonoscopy - Continue oral iron supplementation - Start taking Miralax 1 capful every 12 hours. If after two weeks there is no improvement, increase to 1 capful every 8 hours  All questions were answered.      Carrie Peppers, MD Gastroenterology and  Hepatology [facility]

## 2021-06-13 NOTE — Telephone Encounter (Signed)
LeighAnn Aarin Sparkman, CMA  

## 2021-06-13 NOTE — Patient Instructions (Addendum)
Schedule EGD and colonoscopy Continue oral iron supplementation Start taking Miralax 1 capful every 12 hours. If after two weeks there is no improvement, increase to 1 capful every 8 hours Perform blood workup

## 2021-06-14 ENCOUNTER — Encounter (INDEPENDENT_AMBULATORY_CARE_PROVIDER_SITE_OTHER): Payer: Self-pay

## 2021-06-14 LAB — IRON, TOTAL/TOTAL IRON BINDING CAP
%SAT: 9 % (calc) — ABNORMAL LOW (ref 16–45)
Iron: 30 ug/dL — ABNORMAL LOW (ref 45–160)
TIBC: 327 mcg/dL (calc) (ref 250–450)

## 2021-06-14 LAB — CBC WITH DIFFERENTIAL/PLATELET
Absolute Monocytes: 558 cells/uL (ref 200–950)
Basophils Absolute: 72 cells/uL (ref 0–200)
Basophils Relative: 1.2 %
Eosinophils Absolute: 390 cells/uL (ref 15–500)
Eosinophils Relative: 6.5 %
HCT: 37.4 % (ref 35.0–45.0)
Hemoglobin: 12.1 g/dL (ref 11.7–15.5)
Lymphs Abs: 1902 cells/uL (ref 850–3900)
MCH: 33.2 pg — ABNORMAL HIGH (ref 27.0–33.0)
MCHC: 32.4 g/dL (ref 32.0–36.0)
MCV: 102.5 fL — ABNORMAL HIGH (ref 80.0–100.0)
MPV: 10.8 fL (ref 7.5–12.5)
Monocytes Relative: 9.3 %
Neutro Abs: 3078 cells/uL (ref 1500–7800)
Neutrophils Relative %: 51.3 %
Platelets: 363 10*3/uL (ref 140–400)
RBC: 3.65 10*6/uL — ABNORMAL LOW (ref 3.80–5.10)
RDW: 12.4 % (ref 11.0–15.0)
Total Lymphocyte: 31.7 %
WBC: 6 10*3/uL (ref 3.8–10.8)

## 2021-06-14 LAB — FERRITIN: Ferritin: 24 ng/mL (ref 16–288)

## 2021-06-22 NOTE — Patient Instructions (Signed)
Carrie Adams  06/22/2021     @PREFPERIOPPHARMACY @   Your procedure is scheduled on  06/28/2021.   Report to [facility] at  0730 A.M.   Call this number if you have problems the morning of surgery:  [phone]   Remember:  Follow the diet and prep instructions given to you by the office.    Your last dose of plavix should have been 06/22/2021.    Use your inhaler before you come and bring your rescue inhaler with you.    Take these medicines the morning of surgery with A SIP OF WATER           amlodipine, cymbalta, levothyroxine, metoprolol, prilosec.     Do not wear jewelry, make-up or nail polish.  Do not wear lotions, powders, or perfumes, or deodorant.  Do not shave 48 hours prior to surgery.  Men may shave face and neck.  Do not bring valuables to the hospital.  [facility] is not responsible for any belongings or valuables.  Contacts, dentures or bridgework may not be worn into surgery.  Leave your suitcase in the car.  After surgery it may be brought to your room.  For patients admitted to the hospital, discharge time will be determined by your treatment team.  Patients discharged the day of surgery will not be allowed to drive home and must have someone with them for 24 hours.    Special instructions:   DO NOT smoke tobacco or vape for 24 hours before your procedure.  Please read over the following fact sheets that you were given. Anesthesia Post-op Instructions and Care and Recovery After Surgery      Upper Endoscopy, Adult, Care After This sheet gives you information about how to care for yourself after your procedure. Your health care provider may also give you more specific instructions. If you have problems or questions, contact your health care provider. What can I expect after the procedure? After the procedure, it is common to have: A sore throat. Mild stomach pain or discomfort. Bloating. Nausea. Follow these instructions at  home:  Follow instructions from your health care provider about what to eat or drink after your procedure. Return to your normal activities as told by your health care provider. Ask your health care provider what activities are safe for you. Take over-the-counter and prescription medicines only as told by your health care provider. If you were given a sedative during the procedure, it can affect you for several hours. Do not drive or operate machinery until your health care provider says that it is safe. Keep all follow-up visits as told by your health care provider. This is important. Contact a health care provider if you have: A sore throat that lasts longer than one day. Trouble swallowing. Get help right away if: You vomit blood or your vomit looks like coffee grounds. You have: A fever. Bloody, black, or tarry stools. A severe sore throat or you cannot swallow. Difficulty breathing. Severe pain in your chest or abdomen. Summary After the procedure, it is common to have a sore throat, mild stomach discomfort, bloating, and nausea. If you were given a sedative during the procedure, it can affect you for several hours. Do not drive or operate machinery until your health care provider says that it is safe. Follow instructions from your health care provider about what to eat or drink after your procedure. Return to your normal activities as told by  your health care provider. This information is not intended to replace advice given to you by your health care provider. Make sure you discuss any questions you have with your health care provider. Document Revised: 08/12/2019 Document Reviewed: 01/14/2018 Elsevier Patient Education  2022 [place]. Colonoscopy, Adult, Care After This sheet gives you information about how to care for yourself after your procedure. Your health care provider may also give you more specific instructions. If you have problems or questions, contact your health  care provider. What can I expect after the procedure? After the procedure, it is common to have: A small amount of blood in your stool for 24 hours after the procedure. Some gas. Mild cramping or bloating of your abdomen. Follow these instructions at home: Eating and drinking  Drink enough fluid to keep your urine pale yellow. Follow instructions from your health care provider about eating or drinking restrictions. Resume your normal diet as instructed by your health care provider. Avoid heavy or fried foods that are hard to digest. Activity Rest as told by your health care provider. Avoid sitting for a long time without moving. Get up to take short walks every 1-2 hours. This is important to improve blood flow and breathing. Ask for help if you feel weak or unsteady. Return to your normal activities as told by your health care provider. Ask your health care provider what activities are safe for you. Managing cramping and bloating  Try walking around when you have cramps or feel bloated. Apply heat to your abdomen as told by your health care provider. Use the heat source that your health care provider recommends, such as a moist heat pack or a heating pad. Place a towel between your skin and the heat source. Leave the heat on for 20-30 minutes. Remove the heat if your skin turns bright red. This is especially important if you are unable to feel pain, heat, or cold. You may have a greater risk of getting burned. General instructions If you were given a sedative during the procedure, it can affect you for several hours. Do not drive or operate machinery until your health care provider says that it is safe. For the first 24 hours after the procedure: Do not sign important documents. Do not drink alcohol. Do your regular daily activities at a slower pace than normal. Eat soft foods that are easy to digest. Take over-the-counter and prescription medicines only as told by your health care  provider. Keep all follow-up visits as told by your health care provider. This is important. Contact a health care provider if: You have blood in your stool 2-3 days after the procedure. Get help right away if you have: More than a small spotting of blood in your stool. Large blood clots in your stool. Swelling of your abdomen. Nausea or vomiting. A fever. Increasing pain in your abdomen that is not relieved with medicine. Summary After the procedure, it is common to have a small amount of blood in your stool. You may also have mild cramping and bloating of your abdomen. If you were given a sedative during the procedure, it can affect you for several hours. Do not drive or operate machinery until your health care provider says that it is safe. Get help right away if you have a lot of blood in your stool, nausea or vomiting, a fever, or increased pain in your abdomen. This information is not intended to replace advice given to you by your health care provider. Make sure  you discuss any questions you have with your health care provider. Document Revised: 08/08/2019 Document Reviewed: 03/10/2019 Elsevier Patient Education  [place] After This sheet gives you information about how to care for yourself after your procedure. Your health care provider may also give you more specific instructions. If you have problems or questions, contact your health care provider. What can I expect after the procedure? After the procedure, it is common to have: Tiredness. Forgetfulness about what happened after the procedure. Impaired judgment for important decisions. Nausea or vomiting. Some difficulty with balance. Follow these instructions at home: For the time period you were told by your health care provider:   Rest as needed. Do not participate in activities where you could fall or become injured. Do not drive or use machinery. Do not drink alcohol. Do not  take sleeping pills or medicines that cause drowsiness. Do not make important decisions or sign legal documents. Do not take care of children on your own. Eating and drinking Follow the diet that is recommended by your health care provider. Drink enough fluid to keep your urine pale yellow. If you vomit: Drink water, juice, or soup when you can drink without vomiting. Make sure you have little or no nausea before eating solid foods. General instructions Have a responsible adult stay with you for the time you are told. It is important to have someone help care for you until you are awake and alert. Take over-the-counter and prescription medicines only as told by your health care provider. If you have sleep apnea, surgery and certain medicines can increase your risk for breathing problems. Follow instructions from your health care provider about wearing your sleep device: Anytime you are sleeping, including during daytime naps. While taking prescription pain medicines, sleeping medicines, or medicines that make you drowsy. Avoid smoking. Keep all follow-up visits as told by your health care provider. This is important. Contact a health care provider if: You keep feeling nauseous or you keep vomiting. You feel light-headed. You are still sleepy or having trouble with balance after 24 hours. You develop a rash. You have a fever. You have redness or swelling around the IV site. Get help right away if: You have trouble breathing. You have new-onset confusion at home. Summary For several hours after your procedure, you may feel tired. You may also be forgetful and have poor judgment. Have a responsible adult stay with you for the time you are told. It is important to have someone help care for you until you are awake and alert. Rest as told. Do not drive or operate machinery. Do not drink alcohol or take sleeping pills. Get help right away if you have trouble breathing, or if you suddenly  become confused. This information is not intended to replace advice given to you by your health care provider. Make sure you discuss any questions you have with your health care provider. Document Revised: 04/29/2020 Document Reviewed: 07/17/2019 Elsevier Patient Education  2022 [employer].

## 2021-06-23 ENCOUNTER — Encounter (HOSPITAL_COMMUNITY)
Admission: RE | Admit: 2021-06-23 | Discharge: 2021-06-23 | Disposition: A | Discharge status: Home / Self Care | Payer: Medicare HMO | Source: Ambulatory Visit | Attending: Gastroenterology | Admitting: Gastroenterology

## 2021-06-23 ENCOUNTER — Other Ambulatory Visit: Payer: Self-pay

## 2021-06-23 ENCOUNTER — Encounter (HOSPITAL_COMMUNITY): Payer: Self-pay

## 2021-06-23 VITALS — BP 136/59 | HR 61 | Temp 98.5°F | Resp 18 | Ht 65.0 in | Wt 135.6 lb

## 2021-06-23 DIAGNOSIS — I70212 Atherosclerosis of native arteries of extremities with intermittent claudication, left leg: Secondary | ICD-10-CM | POA: Diagnosis not present

## 2021-06-23 DIAGNOSIS — Z01818 Encounter for other preprocedural examination: Secondary | ICD-10-CM | POA: Diagnosis not present

## 2021-06-23 DIAGNOSIS — I739 Peripheral vascular disease, unspecified: Secondary | ICD-10-CM | POA: Diagnosis not present

## 2021-06-23 DIAGNOSIS — I6522 Occlusion and stenosis of left carotid artery: Secondary | ICD-10-CM | POA: Diagnosis not present

## 2021-06-23 DIAGNOSIS — Z79899 Other long term (current) drug therapy: Secondary | ICD-10-CM | POA: Insufficient documentation

## 2021-06-23 DIAGNOSIS — Z48812 Encounter for surgical aftercare following surgery on the circulatory system: Secondary | ICD-10-CM | POA: Diagnosis not present

## 2021-06-23 LAB — BASIC METABOLIC PANEL
Anion gap: 6 (ref 5–15)
BUN: 14 mg/dL (ref 8–23)
CO2: 30 mmol/L (ref 22–32)
Calcium: 9 mg/dL (ref 8.9–10.3)
Chloride: 100 mmol/L (ref 98–111)
Creatinine, Ser: 0.54 mg/dL (ref 0.44–1.00)
GFR, Estimated: >60 mL/min (ref >60)
Glucose, Bld: 88 mg/dL (ref 70–99)
Potassium: 3.3 mmol/L — ABNORMAL LOW (ref 3.5–5.1)
Sodium: 136 mmol/L (ref 135–145)

## 2021-06-24 ENCOUNTER — Other Ambulatory Visit: Payer: Self-pay | Admitting: "Endocrinology

## 2021-06-24 DIAGNOSIS — E063 Autoimmune thyroiditis: Secondary | ICD-10-CM

## 2021-06-24 DIAGNOSIS — E038 Other specified hypothyroidism: Secondary | ICD-10-CM

## 2021-06-28 ENCOUNTER — Ambulatory Visit (HOSPITAL_COMMUNITY)
Admission: RE | Admit: 2021-06-28 | Discharge: 2021-06-28 | Disposition: A | Discharge status: Home / Self Care | Payer: Medicare HMO | Attending: Gastroenterology | Admitting: Gastroenterology

## 2021-06-28 ENCOUNTER — Ambulatory Visit (HOSPITAL_COMMUNITY): Payer: Medicare HMO | Admitting: Certified Registered"

## 2021-06-28 ENCOUNTER — Encounter (HOSPITAL_COMMUNITY): Payer: Self-pay | Admitting: Gastroenterology

## 2021-06-28 ENCOUNTER — Encounter (HOSPITAL_COMMUNITY)
Admission: RE | Disposition: A | Discharge status: Home / Self Care | Payer: Self-pay | Source: Home / Self Care | Attending: Gastroenterology

## 2021-06-28 ENCOUNTER — Other Ambulatory Visit: Payer: Self-pay

## 2021-06-28 DIAGNOSIS — Z7951 Long term (current) use of inhaled steroids: Secondary | ICD-10-CM | POA: Insufficient documentation

## 2021-06-28 DIAGNOSIS — K449 Diaphragmatic hernia without obstruction or gangrene: Secondary | ICD-10-CM | POA: Diagnosis not present

## 2021-06-28 DIAGNOSIS — Z79899 Other long term (current) drug therapy: Secondary | ICD-10-CM | POA: Insufficient documentation

## 2021-06-28 DIAGNOSIS — D124 Benign neoplasm of descending colon: Secondary | ICD-10-CM | POA: Diagnosis not present

## 2021-06-28 DIAGNOSIS — K219 Gastro-esophageal reflux disease without esophagitis: Secondary | ICD-10-CM | POA: Insufficient documentation

## 2021-06-28 DIAGNOSIS — Z7902 Long term (current) use of antithrombotics/antiplatelets: Secondary | ICD-10-CM | POA: Insufficient documentation

## 2021-06-28 DIAGNOSIS — I739 Peripheral vascular disease, unspecified: Secondary | ICD-10-CM | POA: Diagnosis not present

## 2021-06-28 DIAGNOSIS — K259 Gastric ulcer, unspecified as acute or chronic, without hemorrhage or perforation: Secondary | ICD-10-CM | POA: Diagnosis not present

## 2021-06-28 DIAGNOSIS — Z880 Allergy status to penicillin: Secondary | ICD-10-CM | POA: Diagnosis not present

## 2021-06-28 DIAGNOSIS — K31819 Angiodysplasia of stomach and duodenum without bleeding: Secondary | ICD-10-CM

## 2021-06-28 DIAGNOSIS — B9681 Helicobacter pylori [H. pylori] as the cause of diseases classified elsewhere: Secondary | ICD-10-CM | POA: Diagnosis not present

## 2021-06-28 DIAGNOSIS — K3189 Other diseases of stomach and duodenum: Secondary | ICD-10-CM | POA: Diagnosis not present

## 2021-06-28 DIAGNOSIS — Z8673 Personal history of transient ischemic attack (TIA), and cerebral infarction without residual deficits: Secondary | ICD-10-CM | POA: Diagnosis not present

## 2021-06-28 DIAGNOSIS — D126 Benign neoplasm of colon, unspecified: Secondary | ICD-10-CM | POA: Diagnosis not present

## 2021-06-28 DIAGNOSIS — Z801 Family history of malignant neoplasm of trachea, bronchus and lung: Secondary | ICD-10-CM | POA: Diagnosis not present

## 2021-06-28 DIAGNOSIS — K2950 Unspecified chronic gastritis without bleeding: Secondary | ICD-10-CM | POA: Insufficient documentation

## 2021-06-28 DIAGNOSIS — D122 Benign neoplasm of ascending colon: Secondary | ICD-10-CM | POA: Insufficient documentation

## 2021-06-28 DIAGNOSIS — D120 Benign neoplasm of cecum: Secondary | ICD-10-CM | POA: Diagnosis not present

## 2021-06-28 DIAGNOSIS — D509 Iron deficiency anemia, unspecified: Secondary | ICD-10-CM | POA: Diagnosis not present

## 2021-06-28 DIAGNOSIS — E039 Hypothyroidism, unspecified: Secondary | ICD-10-CM | POA: Diagnosis not present

## 2021-06-28 DIAGNOSIS — Z79890 Hormone replacement therapy (postmenopausal): Secondary | ICD-10-CM | POA: Diagnosis not present

## 2021-06-28 DIAGNOSIS — D123 Benign neoplasm of transverse colon: Secondary | ICD-10-CM | POA: Diagnosis not present

## 2021-06-28 DIAGNOSIS — K635 Polyp of colon: Secondary | ICD-10-CM | POA: Diagnosis not present

## 2021-06-28 DIAGNOSIS — F17210 Nicotine dependence, cigarettes, uncomplicated: Secondary | ICD-10-CM | POA: Diagnosis not present

## 2021-06-28 HISTORY — PX: POLYPECTOMY: SHX5525

## 2021-06-28 HISTORY — PX: COLONOSCOPY WITH PROPOFOL: SHX5780

## 2021-06-28 HISTORY — PX: HOT HEMOSTASIS: SHX5433

## 2021-06-28 HISTORY — PX: ESOPHAGOGASTRODUODENOSCOPY (EGD) WITH PROPOFOL: SHX5813

## 2021-06-28 HISTORY — PX: BIOPSY: SHX5522

## 2021-06-28 LAB — HM COLONOSCOPY

## 2021-06-28 SURGERY — COLONOSCOPY WITH PROPOFOL
Anesthesia: General

## 2021-06-28 MED ORDER — PROPOFOL 10 MG/ML IV BOLUS
INTRAVENOUS | Status: DC | PRN
  Administered 2021-06-28: 100 mg via INTRAVENOUS
  Administered 2021-06-28: 100 ug/kg/min via INTRAVENOUS

## 2021-06-28 MED ORDER — LIDOCAINE HCL (CARDIAC) PF 100 MG/5ML IV SOSY
PREFILLED_SYRINGE | INTRAVENOUS | Status: DC | PRN
  Administered 2021-06-28: 80 mg via INTRAVENOUS

## 2021-06-28 MED ORDER — LACTATED RINGERS IV SOLN: INTRAVENOUS | Status: DC

## 2021-06-28 MED ORDER — GLUCAGON HCL RDNA (DIAGNOSTIC) 1 MG IJ SOLR
INTRAMUSCULAR | Status: DC | PRN
  Administered 2021-06-28: .25 mg via INTRAVENOUS

## 2021-06-28 NOTE — Anesthesia Postprocedure Evaluation (Signed)
Anesthesia Post Note  Patient: ASYAH CANDLER  Procedure(s) Performed: COLONOSCOPY WITH PROPOFOL ESOPHAGOGASTRODUODENOSCOPY (EGD) WITH PROPOFOL BIOPSY HOT HEMOSTASIS (ARGON PLASMA COAGULATION/BICAP) POLYPECTOMY  Patient location during evaluation: Phase II Anesthesia Type: General Level of consciousness: awake Pain management: pain level controlled Vital Signs Assessment: post-procedure vital signs reviewed and stable Respiratory status: spontaneous breathing and respiratory function stable Cardiovascular status: blood pressure returned to baseline and stable Postop Assessment: no headache and no apparent nausea or vomiting Anesthetic complications: no Comments: Late entry   No notable events documented.   Last Vitals:  Vitals:   06/28/21 0802 06/28/21 1017  BP: (!) 137/56 (!) 120/51  Pulse: 62 62  Resp: 17 13  Temp: 36.4 C 36.4 C  SpO2: 97% 99%    Last Pain:  Vitals:   06/28/21 1018  TempSrc:   PainSc: 0-No pain                 Louann Sjogren

## 2021-06-28 NOTE — Transfer of Care (Signed)
Immediate Anesthesia Transfer of Care Note  Patient: Carrie Adams  Procedure(s) Performed: COLONOSCOPY WITH PROPOFOL ESOPHAGOGASTRODUODENOSCOPY (EGD) WITH PROPOFOL BIOPSY HOT HEMOSTASIS (ARGON PLASMA COAGULATION/BICAP) POLYPECTOMY  Patient Location: Short Stay  Anesthesia Type:General  Level of Consciousness: awake and patient cooperative  Airway & Oxygen Therapy: Patient Spontanous Breathing  Post-op Assessment: Report given to RN and Post -op Vital signs reviewed and stable  Post vital signs: Reviewed and stable  Last Vitals:  Vitals Value Taken Time  BP 120/51 06/28/21 1017  Temp 36.4 C 06/28/21 1017  Pulse 62 06/28/21 1017  Resp 13 06/28/21 1017  SpO2 99 % 06/28/21 1017    Last Pain:  Vitals:   06/28/21 1018  TempSrc:   PainSc: 0-No pain         Complications: No notable events documented.

## 2021-06-28 NOTE — Op Note (Addendum)
[facility] Patient Name: Carrie Adams Procedure Date: 06/28/2021 8:57 AM MRN: [id number] Date of Birth: August 18, 1956 Attending MD: [name] ,  CSN: [id number] Age: 65 Admit Type: Outpatient Procedure:                Upper GI endoscopy Indications:              Iron deficiency anemia Providers:                [name], Caprice Kluver, Janeece Riggers, RN,                            Nelma Rothman, Technician Referring MD:              Medicines:                Monitored Anesthesia Care Complications:            No immediate complications. Estimated Blood Loss:     Estimated blood loss: none. Procedure:                Pre-Anesthesia Assessment:                           - Prior to the procedure, a History and Physical                            was performed, and patient medications, allergies                            and sensitivities were reviewed. The patient's                            tolerance of previous anesthesia was reviewed.                           - The risks and benefits of the procedure and the                            sedation options and risks were discussed with the                            patient. All questions were answered and informed                            consent was obtained.                           - ASA Grade Assessment: III - A patient with severe                            systemic disease.                           After obtaining informed consent, the endoscope was                            passed under direct vision. Throughout the  procedure, the patient's blood pressure, pulse, and                            oxygen saturations were monitored continuously. The                            [id number]-H190 ([id number]) scope was introduced through the                            mouth, and advanced to the third part of duodenum.                            The upper GI endoscopy was accomplished without                             difficulty. The patient tolerated the procedure                            well. Scope In: 9:14:21 AM Scope Out: 9:28:56 AM Total Procedure Duration: 0 hours 14 minutes 35 seconds  Findings:      A 1 cm hiatal hernia was present.      A few localized small erosions with no stigmata of recent bleeding were       found in the gastric body. Biopsies were taken with a cold forceps for       Helicobacter pylori testing.      The first portion of the duodenum and second portion of the duodenum       were normal. Biopsies were taken with a cold forceps for histology.      Two small angioectasias without bleeding were found in the third portion       of the duodenum. Coagulation for bleeding prevention using argon plasma       at 0.3 liters/minute and 20 watts was successful. Impression:               - 1 cm hiatal hernia.                           - Erosive gastropathy with no stigmata of recent                            bleeding. Biopsied.                           - Normal first portion of the duodenum and second                            portion of the duodenum. Biopsied.                           - Two non-bleeding angioectasias in the duodenum.                            Treated with argon plasma coagulation (APC). Moderate Sedation:      Per Anesthesia Care Recommendation:           -  Discharge patient to home (ambulatory).                           - Resume previous diet.                           - Await pathology results.                           - Restart Plavix in 3 days.                           - Follow up in GI clinic in 3 months - will recheck                            iron stores and CBC.                           - Continue oral iron. Procedure Code(s):        --- Professional ---                           [phone], 72, Esophagogastroduodenoscopy, flexible,                            transoral; with control of bleeding, any method                            43239, Esophagogastroduodenoscopy, flexible,                            transoral; with biopsy, single or multiple Diagnosis Code(s):        --- Professional ---                           K44.9, Diaphragmatic hernia without obstruction or                            gangrene                           K31.89, Other diseases of stomach and duodenum                           K31.819, Angiodysplasia of stomach and duodenum                            without bleeding                           D50.9, Iron deficiency anemia, unspecified CPT copyright 2019 [employer]. All rights reserved. The codes documented in this report are preliminary and upon coder review may  be revised to meet current compliance requirements. [name], MD [name],  06/28/2021 9:33:19 AM This report has been signed electronically. Number of Addenda: 0

## 2021-06-28 NOTE — Discharge Instructions (Addendum)
You are being discharged to home.  Resume your previous diet.  We are waiting for your pathology results.  Restart Plavix in 3 days. Follow up in GI clinic in 3 months. Continue oral iron. Smoking cessation. Your physician has recommended a repeat colonoscopy in one year for surveillance.  Repeat colonoscopy in 1 year for surveillance due to poor prep - will need a two day prep.

## 2021-06-28 NOTE — Interval H&P Note (Signed)
History and Physical Interval Note:  06/28/2021 8:24 AM Carrie Adams is a 65 y.o. female with PMH GERD, peripheral artery disease status post placement of stents, AAA status post endovascular repair, anxiety, hypothyroidism, stroke, who presents for evaluation of iron deficiency anemia.  BP (!) 137/56   Pulse 62   Temp 97.6 F (36.4 C) (Oral)   Resp 17   SpO2 97%  GENERAL: The patient is AO x3, in no acute distress. HEENT: Head is normocephalic and atraumatic. EOMI are intact. Mouth is well hydrated and without lesions. NECK: Supple. No masses LUNGS: Clear to auscultation. No presence of rhonchi/wheezing/rales. Adequate chest expansion HEART: RRR, normal s1 and s2. ABDOMEN: Soft, nontender, no guarding, no peritoneal signs, and nondistended. BS +. No masses. EXTREMITIES: Without any cyanosis, clubbing, rash, lesions or edema. NEUROLOGIC: AOx3, no focal motor deficit. SKIN: no jaundice, no rashes  JASDEEP DEJARNETT  has presented today for surgery, with the diagnosis of Iron deficiency anemia.  The various methods of treatment have been discussed with the patient and family. After consideration of risks, benefits and other options for treatment, the patient has consented to  Procedure(s) with comments: COLONOSCOPY WITH PROPOFOL (N/A) - 9:05 ESOPHAGOGASTRODUODENOSCOPY (EGD) WITH PROPOFOL (N/A) as a surgical intervention.  The patient's history has been reviewed, patient examined, no change in status, stable for surgery.  I have reviewed the patient's chart and labs.  Questions were answered to the patient's satisfaction.     [name] Mayorga

## 2021-06-28 NOTE — Op Note (Signed)
[facility] Patient Name: Carrie Adams Procedure Date: 06/28/2021 9:31 AM MRN: [id number] Date of Birth: 1956/04/04 Attending MD: [name] ,  CSN: [id number] Age: 65 Admit Type: Outpatient Procedure:                Colonoscopy Indications:              Iron deficiency anemia Providers:                [name], Caprice Kluver, Janeece Riggers, RN,                            Nelma Rothman, Technician Referring MD:              Medicines:                Monitored Anesthesia Care Complications:            No immediate complications. Estimated Blood Loss:     Estimated blood loss: none. Procedure:                Pre-Anesthesia Assessment:                           - Prior to the procedure, a History and Physical                            was performed, and patient medications, allergies                            and sensitivities were reviewed. The patient's                            tolerance of previous anesthesia was reviewed.                           - The risks and benefits of the procedure and the                            sedation options and risks were discussed with the                            patient. All questions were answered and informed                            consent was obtained.                           - ASA Grade Assessment: III - A patient with severe                            systemic disease.                           After obtaining informed consent, the colonoscope                            was passed under direct vision. Throughout the  procedure, the patient's blood pressure, pulse, and                            oxygen saturations were monitored continuously. The                            [id number]-HQ190L ([id number]) scope was introduced through                            the anus and advanced to the the cecum, identified                            by appendiceal orifice and ileocecal valve. The                             colonoscopy was performed with difficulty due to                            inadequate bowel prep. The patient tolerated the                            procedure well. The quality of the bowel                            preparation was fair. Scope In: 9:34:43 AM Scope Out: 10:11:49 AM Scope Withdrawal Time: 0 hours 28 minutes 3 seconds  Total Procedure Duration: 0 hours 37 minutes 6 seconds  Findings:      The perianal and digital rectal examinations were normal.      A 2 mm polyp was found in the cecum. The polyp was sessile. The polyp       was removed with a cold biopsy forceps. Resection and retrieval were       complete.      Four sessile polyps were found in the descending colon, ascending colon       and cecum. The polyps were 4 to 8 mm in size. These polyps were removed       with a cold snare. Resection and retrieval were complete.      A large amount of liquid stool was found in the entire colon, making       visualization difficult. Fluid aspiration was performed.      The retroflexed view of the distal rectum and anal verge was normal and       showed no anal or rectal abnormalities. Impression:               - Preparation of the colon was fair.                           - One 2 mm polyp in the cecum, removed with a cold                            biopsy forceps. Resected and retrieved.                           - Four 4 to 8 mm polyps in  the descending colon, in                            the ascending colon and in the cecum, removed with                            a cold snare. Resected and retrieved.                           - Stool in the entire examined colon. Fluid                            aspiration performed.                           - The distal rectum and anal verge are normal on                            retroflexion view. Moderate Sedation:      Per Anesthesia Care Recommendation:           - Discharge patient to home (ambulatory).                            - Resume previous diet.                           - Await pathology results.                           - Repeat colonoscopy in 1 year for surveillance due                            to poor prep - will need a two day prep. Procedure Code(s):        --- Professional ---                           [phone], Colonoscopy, flexible; with removal of                            tumor(s), polyp(s), or other lesion(s) by snare                            technique                           45380, 29, Colonoscopy, flexible; with biopsy,                            single or multiple Diagnosis Code(s):        --- Professional ---                           K63.5, Polyp of colon                           D50.9, Iron deficiency  anemia, unspecified CPT copyright 2019 American [employer] Association. All rights reserved. The codes documented in this report are preliminary and upon coder review may  be revised to meet current compliance requirements. [name], MD [name],  06/28/2021 10:53:43 AM This report has been signed electronically. Number of Addenda: 0

## 2021-06-28 NOTE — Pre-Procedure Instructions (Signed)
RE: reschedule due to plavix Received: 5 days ago Lovelace, Michelene Gardener, CMA  Jacqulynn Cadet, RN Noted, thank you        Previous Messages   ----- Message -----  From: Jacqulynn Cadet, RN  Sent: 06/23/2021  10:48 AM EDT  To: Michelene Gardener Lovelace, CMA  Subject: reschedule due to plavix                       Dr Eula Listen wants to cancel Carrie Adams due to her taking her Plavix this morning. She will need to be rescheduled.  He would like to fill her spot with someone else if possible.  Just let me know.  I've attempted to contact the patient several times but unable to get an answer but I will keep trying.

## 2021-06-28 NOTE — Anesthesia Preprocedure Evaluation (Signed)
Anesthesia Evaluation  Patient identified by MRN, date of birth, ID band Patient awake    Reviewed: Allergy & Precautions, H&P , NPO status , Patient's Chart, lab work & pertinent test results, reviewed documented beta blocker date and time   Airway Mallampati: II  TM Distance: >3 FB Neck ROM: full    Dental no notable dental hx.    Pulmonary neg pulmonary ROS, Current Smoker and Patient abstained from smoking.,    Pulmonary exam normal breath sounds clear to auscultation       Cardiovascular Exercise Tolerance: Good + Peripheral Vascular Disease   Rhythm:regular Rate:Normal     Neuro/Psych Anxiety CVA, Residual Symptoms negative psych ROS   GI/Hepatic negative GI ROS, Neg liver ROS,   Endo/Other  Hypothyroidism   Renal/GU negative Renal ROS  negative genitourinary   Musculoskeletal   Abdominal   Peds  Hematology  (+) Blood dyscrasia, anemia ,   Anesthesia Other Findings   Reproductive/Obstetrics negative OB ROS                             Anesthesia Physical Anesthesia Plan  ASA: 3  Anesthesia Plan: General   Post-op Pain Management:    Induction:   PONV Risk Score and Plan: Propofol infusion  Airway Management Planned:   Additional Equipment:   Intra-op Plan:   Post-operative Plan:   Informed Consent: I have reviewed the patients History and Physical, chart, labs and discussed the procedure including the risks, benefits and alternatives for the proposed anesthesia with the patient or authorized representative who has indicated his/her understanding and acceptance.     Dental Advisory Given  Plan Discussed with: CRNA  Anesthesia Plan Comments:         Anesthesia Quick Evaluation

## 2021-06-30 ENCOUNTER — Other Ambulatory Visit (INDEPENDENT_AMBULATORY_CARE_PROVIDER_SITE_OTHER): Payer: Self-pay | Admitting: Gastroenterology

## 2021-06-30 DIAGNOSIS — B9681 Helicobacter pylori [H. pylori] as the cause of diseases classified elsewhere: Secondary | ICD-10-CM

## 2021-06-30 LAB — SURGICAL PATHOLOGY

## 2021-06-30 MED ORDER — METRONIDAZOLE 500 MG PO TABS: 500.0000 mg | ORAL_TABLET | Freq: Three times a day (TID) | ORAL | 0 refills | Status: AC

## 2021-06-30 MED ORDER — OMEPRAZOLE 40 MG PO CPDR: 40.0000 mg | DELAYED_RELEASE_CAPSULE | Freq: Two times a day (BID) | ORAL | 0 refills | Status: DC

## 2021-06-30 MED ORDER — TETRACYCLINE HCL 500 MG PO CAPS: 500.0000 mg | ORAL_CAPSULE | Freq: Four times a day (QID) | ORAL | 0 refills | Status: AC

## 2021-06-30 MED ORDER — BISMUTH 262 MG PO CHEW: 2.0000 | CHEWABLE_TABLET | Freq: Four times a day (QID) | ORAL | 0 refills | Status: AC

## 2021-07-01 ENCOUNTER — Encounter (HOSPITAL_COMMUNITY): Payer: Self-pay | Admitting: Gastroenterology

## 2021-07-01 ENCOUNTER — Encounter (INDEPENDENT_AMBULATORY_CARE_PROVIDER_SITE_OTHER): Payer: Self-pay | Admitting: *Deleted

## 2021-07-04 DIAGNOSIS — Z0000 Encounter for general adult medical examination without abnormal findings: Secondary | ICD-10-CM | POA: Diagnosis not present

## 2021-07-04 DIAGNOSIS — J449 Chronic obstructive pulmonary disease, unspecified: Secondary | ICD-10-CM | POA: Diagnosis not present

## 2021-07-04 DIAGNOSIS — M545 Low back pain, unspecified: Secondary | ICD-10-CM | POA: Diagnosis not present

## 2021-07-04 DIAGNOSIS — R5383 Other fatigue: Secondary | ICD-10-CM | POA: Diagnosis not present

## 2021-07-04 DIAGNOSIS — Z6822 Body mass index (BMI) 22.0-22.9, adult: Secondary | ICD-10-CM | POA: Diagnosis not present

## 2021-07-04 DIAGNOSIS — I10 Essential (primary) hypertension: Secondary | ICD-10-CM | POA: Diagnosis not present

## 2021-08-05 DIAGNOSIS — E0500 Thyrotoxicosis with diffuse goiter without thyrotoxic crisis or storm: Secondary | ICD-10-CM | POA: Diagnosis not present

## 2021-08-05 DIAGNOSIS — E063 Autoimmune thyroiditis: Secondary | ICD-10-CM | POA: Diagnosis not present

## 2021-08-05 DIAGNOSIS — E038 Other specified hypothyroidism: Secondary | ICD-10-CM | POA: Diagnosis not present

## 2021-08-05 LAB — TSH: TSH: 8.34 — AB (ref <=5.90)

## 2021-08-08 DIAGNOSIS — H524 Presbyopia: Secondary | ICD-10-CM | POA: Diagnosis not present

## 2021-08-11 ENCOUNTER — Ambulatory Visit: Payer: Medicare HMO | Admitting: "Endocrinology

## 2021-08-25 ENCOUNTER — Ambulatory Visit: Payer: Medicare HMO | Admitting: "Endocrinology

## 2021-08-30 ENCOUNTER — Encounter: Payer: Self-pay | Admitting: "Endocrinology

## 2021-08-30 ENCOUNTER — Ambulatory Visit (INDEPENDENT_AMBULATORY_CARE_PROVIDER_SITE_OTHER): Payer: Medicare HMO | Admitting: "Endocrinology

## 2021-08-30 ENCOUNTER — Other Ambulatory Visit: Payer: Self-pay

## 2021-08-30 VITALS — BP 108/76 | HR 60 | Ht 65.0 in | Wt 138.8 lb

## 2021-08-30 DIAGNOSIS — E038 Other specified hypothyroidism: Secondary | ICD-10-CM

## 2021-08-30 DIAGNOSIS — E063 Autoimmune thyroiditis: Secondary | ICD-10-CM | POA: Diagnosis not present

## 2021-08-30 MED ORDER — LEVOTHYROXINE SODIUM 112 MCG PO TABS: 112.0000 ug | ORAL_TABLET | Freq: Every day | ORAL | 1 refills | Status: DC

## 2021-08-30 NOTE — Progress Notes (Signed)
08/30/2021  Endocrinology follow-up note   Subjective:    Patient ID: Carrie Adams, female    DOB: 1955/09/18, PCP [name], MD   Past Medical History:  Diagnosis Date   Anxiety    Arthritis    Hypothyroidism    PAD (peripheral artery disease) ([place])    PAD (peripheral artery disease) ([place])    3 stents in groin and 1 in thigh   Stroke ([place])    several, last 2013, balance problems, memory loss occ   Venous thrombosis    Past Surgical History:  Procedure Laterality Date   ABDOMINAL AORTIC ANEURYSM REPAIR  2021   ABDOMINAL HYSTERECTOMY     partial   APPENDECTOMY     with hysterectomy   BIOPSY  06/28/2021   Procedure: BIOPSY;  Surgeon: Harvel Quale, MD;  [facility];  Service: Gastroenterology;;   CAROTID STENT  2018   COLONOSCOPY WITH PROPOFOL N/A 06/28/2021   Procedure: COLONOSCOPY WITH PROPOFOL;  Surgeon: Harvel Quale, MD;  [facility];  Service: Gastroenterology;  Laterality: N/A;  9:05   COSMETIC SURGERY Bilateral    breast implants    ESOPHAGOGASTRODUODENOSCOPY (EGD) WITH PROPOFOL N/A 06/28/2021   Procedure: ESOPHAGOGASTRODUODENOSCOPY (EGD) WITH PROPOFOL;  Surgeon: Harvel Quale, MD;  [facility];  Service: Gastroenterology;  Laterality: N/A;   FEMUR IM NAIL  11/10/2011   Procedure: INTRAMEDULLARY (IM) NAIL FEMORAL;  Surgeon: [name], MD;  Location: [facility];  Service: Orthopedics;  Laterality: Left;   HARDWARE REMOVAL Left 04/06/2016   Procedure: HARDWARE REMOVAL LEFT HIP;  Surgeon: Rod Can, MD;  Location: [facility];  Service: Orthopedics;  Laterality: Left;   HOT HEMOSTASIS  06/28/2021   Procedure: HOT HEMOSTASIS (ARGON PLASMA COAGULATION/BICAP);  Surgeon: Montez Morita, Quillian Quince, MD;  [facility];  Service: Gastroenterology;;  small bowel AVMs   POLYPECTOMY  06/28/2021   Procedure: POLYPECTOMY;  Surgeon: Harvel Quale, MD;  [facility];  Service:  Gastroenterology;;   Social History   Socioeconomic History   Marital status: Divorced    Spouse name: Not on file   Number of children: Not on file   Years of education: Not on file   Highest education level: Not on file  Occupational History   Not on file  Tobacco Use   Smoking status: Every Day    Packs/day: 0.50    Years: 33.00    Pack years: 16.50    Types: Cigarettes   Smokeless tobacco: Never  Vaping Use   Vaping Use: Never used  Substance and Sexual Activity   Alcohol use: No   Drug use: No   Sexual activity: Not on file  Other Topics Concern   Not on file  Social History Narrative   Not on file   Social Determinants of Health   Financial Resource Strain: Not on file  Food Insecurity: Not on file  Transportation Needs: Not on file  Physical Activity: Not on file  Stress: Not on file  Social Connections: Not on file   Outpatient Encounter Medications as of 08/30/2021  Medication Sig   albuterol (VENTOLIN HFA) 108 (90 Base) MCG/ACT inhaler Inhale 1 puff into the lungs every 4 (four) hours as needed.   ALLERGY RELIEF 180 MG tablet Take 1 tablet by mouth daily.   amLODipine (NORVASC) 5 MG tablet Take 1 tablet by mouth daily.   aspirin EC 81 MG tablet Take 81 mg by mouth daily.  calcium carbonate (OS-CAL - DOSED IN MG OF ELEMENTAL CALCIUM) 1250 (500 Ca) MG tablet Take 1 tablet by mouth daily with breakfast.   chlorthalidone (HYGROTON) 25 MG tablet Take 25 mg by mouth daily.   clopidogrel (PLAVIX) 75 MG tablet Take 75 mg by mouth daily.   Cyanocobalamin (VITAMIN B-12 PO) Take 1 tablet by mouth daily.   DULoxetine (CYMBALTA) 60 MG capsule Take 60 mg by mouth 2 (two) times daily.   ferrous sulfate 325 (65 FE) MG tablet Take 325 mg by mouth 3 (three) times daily with meals.   levothyroxine (SYNTHROID) 112 MCG tablet Take 1 tablet (112 mcg total) by mouth daily before breakfast.   metoprolol succinate (TOPROL-XL) 50 MG 24 hr tablet Take 50 mg by mouth 2 (two) times  daily.   mupirocin ointment (BACTROBAN) 2 % as needed.   nystatin cream (MYCOSTATIN) Apply topically as needed.   Omega-3 Fatty Acids (FISH OIL) 1000 MG CAPS Take 1 capsule by mouth daily.   omeprazole (PRILOSEC) 20 MG capsule Take 20 mg by mouth daily.   OVER THE COUNTER MEDICATION Vit D one po Qd   pravastatin (PRAVACHOL) 20 MG tablet Take 20 mg by mouth at bedtime.   triamcinolone cream (KENALOG) 0.1 % Apply 1 application topically daily.   vitamin C (ASCORBIC ACID) 500 MG tablet Take 500 mg by mouth daily.   [DISCONTINUED] levothyroxine (SYNTHROID) 100 MCG tablet TAKE 1 TABLET(100 MCG) BY MOUTH DAILY BEFORE BREAKFAST   [DISCONTINUED] omeprazole (PRILOSEC) 40 MG capsule Take 1 capsule (40 mg total) by mouth 2 (two) times daily for 14 days.   [DISCONTINUED] tretinoin (RETIN-A) 0.01 % gel as needed.   No facility-administered encounter medications on file as of 08/30/2021.   ALLERGIES: Allergies  Allergen Reactions   Amoxicillin Nausea And Vomiting    Has patient had a PCN reaction causing immediate rash, facial/tongue/throat swelling, SOB or lightheadedness with hypotension: No Has patient had a PCN reaction causing severe rash involving mucus membranes or skin necrosis: No Has patient had a PCN reaction that required hospitalization No Has patient had a PCN reaction occurring within the last 10 years: No If all of the above answers are "NO", then may proceed with Cephalosporin use.     VACCINATION STATUS: Immunization History  Administered Date(s) Administered   Influenza-Unspecified 05/28/2018    HPI 66 year old female patient with medical history as above.  She is returning for follow-up in the management of her longstanding hypothyroidism from Hashimoto's thyroiditis.    She is currently on a stable dose of levothyroxine 100 mcg p.o. daily before breakfast.  She reports compliance and consistency taking her medication.  She has no new complaints today.  Her previsit thyroid  function tests are consistent with slight under replacement.  Her 2018 thyroid ultrasound was unremarkable.  Repeat ultrasound on February 19, 2020 showed similar heterogeneity of thyroid parenchyma suggestive of chronic thyroiditis.  No focal nodules or hypervascularity. -She presents with mildly fluctuating body weight, denies palpitations, tremors, nor heat intolerance.  - She denies any history of goiter nor thyroid surgery. She denies any history of ablative thyroid therapy. - She has family history of hypothyroidism in one of her sisters as well as in her mother. - She has mostly been light weight most of her adult life. She denies heat/cold intolerance. She is a chronic active smoker, currently being treated for bronchitis.  Review of Systems Limited as above.  Objective:    BP 108/76    Pulse 60  Ht 5\' 5"  (1.651 m)    Wt 138 lb 12.8 oz (63 kg)    BMI 23.10 kg/m   Wt Readings from Last 3 Encounters:  08/30/21 138 lb 12.8 oz (63 kg)  06/23/21 135 lb 9.6 oz (61.5 kg)  06/13/21 135 lb 9.6 oz (61.5 kg)     Physical Exam- Limited  Constitutional:  Body mass index is 23.1 kg/m. , not in acute distress,   TSH from 08/18/2016 was 2.4. 08/14/2017: TSH 2.2, free T4 1. 3 Labs from February 13, 2018 showed TSH 0.55, free T4 1.4 9  September 23, 2018 labs TSH 0-7 1, free T4 1.53 February 20, 2019 total T4 9.3 Recent Results (from the past 2160 hour(s))  CBC with Differential/Platelet     Status: Abnormal   Collection Time: 06/13/21  3:58 PM  Result Value Ref Range   WBC 6.0 3.8 - 10.8 Thousand/uL   RBC 3.65 (L) 3.80 - 5.10 Million/uL   Hemoglobin 12.1 11.7 - 15.5 g/dL   HCT 37.4 35.0 - 45.0 %   MCV 102.5 (H) 80.0 - 100.0 fL   MCH 33.2 (H) 27.0 - 33.0 pg   MCHC 32.4 32.0 - 36.0 g/dL   RDW 12.4 11.0 - 15.0 %   Platelets 363 140 - 400 Thousand/uL   MPV 10.8 7.5 - 12.5 fL   Neutro Abs 3,078 1,500 - 7,800 cells/uL   Lymphs Abs 1,902 850 - 3,900 cells/uL   Absolute Monocytes 558 200 - 950  cells/uL   Eosinophils Absolute 390 15 - 500 cells/uL   Basophils Absolute 72 0 - 200 cells/uL   Neutrophils Relative % 51.3 %   Total Lymphocyte 31.7 %   Monocytes Relative 9.3 %   Eosinophils Relative 6.5 %   Basophils Relative 1.2 %  Iron, Total/Total Iron Binding Cap     Status: Abnormal   Collection Time: 06/13/21  3:58 PM  Result Value Ref Range   Iron 30 (L) 45 - 160 mcg/dL   TIBC 327 250 - 450 mcg/dL (calc)   %SAT 9 (L) 16 - 45 % (calc)  Ferritin     Status: None   Collection Time: 06/13/21  3:58 PM  Result Value Ref Range   Ferritin 24 16 - 288 ng/mL  Basic metabolic panel     Status: Abnormal   Collection Time: 06/23/21 10:17 AM  Result Value Ref Range   Sodium 136 135 - 145 mmol/L   Potassium 3.3 (L) 3.5 - 5.1 mmol/L   Chloride 100 98 - 111 mmol/L   CO2 30 22 - 32 mmol/L   Glucose, Bld 88 70 - 99 mg/dL    Comment: Glucose reference range applies only to samples taken after fasting for at least 8 hours.   BUN 14 8 - 23 mg/dL   Creatinine, Ser 0.54 0.44 - 1.00 mg/dL   Calcium 9.0 8.9 - 10.3 mg/dL   GFR, Estimated >60 >60 mL/min    Comment: (NOTE) Calculated using the CKD-EPI Creatinine Equation (2021)    Anion gap 6 5 - 15    Comment: Performed at [facility], [address] [place], [place] [address]  HM COLONOSCOPY     Status: None   Collection Time: 06/28/21 12:00 AM  Result Value Ref Range   HM Colonoscopy See Report (in chart) See Report (in chart), Patient Reported  Surgical pathology     Status: None   Collection Time: 06/28/21  9:24 AM  Result Value Ref Range   SURGICAL  PATHOLOGY      SURGICAL PATHOLOGY CASE: [id number] PATIENT: Jossie Lard Surgical Pathology Report     Clinical History: iron deficiency anemia   FINAL MICROSCOPIC DIAGNOSIS:  A. SMALL BOWEL, BIOPSY: -  Benign duodenal mucosa -  No acute inflammation, villous blunting or increased intraepithelial lymphocytes identified  B. STOMACH, BIOPSY: -  Chronic active  gastritis -  H. pylori organisms present -  No intestinal metaplasia identified -  See comment  C. COLON, CECAL, ASCENDING, POLYPECTOMY: -  Multiple fragments of tubular adenoma(s) -  No high-grade dysplasia or malignancy identified  D. COLON, DESCENDING, POLYPECTOMY: -  Tubular adenoma (1 of 1 fragments) -  No high-grade dysplasia or malignancy identified  COMMENT:  B.  Warthin-Starry stain is POSITIVE for organisms consistent with Helicobacter pylori.  GROSS DESCRIPTION:  A: Received in formalin are tan, soft tissue fragments that are submitted in toto. Number: Multiple size: Range from 0.2 to 0.6 cm b locks: 1  B: Received in formalin are tan, soft tissue fragments that are submitted in toto. Number: 3 size: Range from 0.3 to 0.4 cm blocks: 1  C: Received in formalin are tan, soft tissue fragments that are submitted in toto. Number: Multiple size: Range from 0.2 to 0.3 cm blocks: 1  D: Received in formalin is a tan, soft tissue fragment that is submitted in toto.  Size: 0.3 cm, 1 block submitted.  Craig Staggers 06/28/2021)    Final Diagnosis performed by Thressa Sheller, MD.   Electronically signed 06/30/2021 Technical component performed at [facility], [place] [address] [place], [place] [address].  Professional component performed at [employer]. [facility], [place] [address] [place], [place] [address].  Immunohistochemistry Technical component (if applicable) was performed at [facility]. [address] [place], [place], [place] [address].   IMMUNOHISTOCHEMISTRY DISCLAIMER (if applicable): Some of these immunohistochemical stains m ay have been developed and the performance characteristics determine by [facility]. Some may not have been cleared or approved by the U.S. Food and Drug Administration. The FDA has determined that such clearance or approval is not necessary. This test is used for clinical purposes. It should  not be regarded as investigational or for research. This laboratory is certified under the [place] (CLIA-88) as qualified to perform high complexity clinical laboratory testing.  The controls stained appropriately.   TSH     Status: Abnormal   Collection Time: 08/05/21 12:00 AM  Result Value Ref Range   TSH 8.34 (A) 0.41 - 5.90    Comment: Free T4 is 0.73     Assessment & Plan:   1. Hypothyroidism 2. Hashimoto's thyroiditis  -Her previsit thyroid function tests are consistent with slight under replacement.  I discussed and increase her levothyroxine to 112 mcg p.o. daily before breakfast.     - We discussed about the correct intake of her thyroid hormone, on empty stomach at fasting, with water, separated by at least 30 minutes from breakfast and other medications,  and separated by more than 4 hours from calcium, iron, multivitamins, acid reflux medications (PPIs). -Patient is made aware of the fact that thyroid hormone replacement is needed for life, dose to be adjusted by periodic monitoring of thyroid function tests.    - Her recent  thyroid ultrasound is consistent with heterogeneous thyroid parenchyma suggestive of chronic thyroiditis with no focal nodules.    This is consistent with heterogeneous texture from chronic thyroiditis/Hashimoto's thyroiditis.  She will not  need intervention for now.   - I advised patient to maintain close follow up with [name], MD for primary care needs.  I spent 21 minutes in the care of the patient today including review of labs from Thyroid Function, CMP, and other relevant labs ; imaging/biopsy records (current and previous including abstractions from other facilities); face-to-face time discussing  her lab results and symptoms, medications doses, her options of short and long term treatment based on the latest standards of care / guidelines;   and documenting the encounter.  Carrie Adams   participated in the discussions, expressed understanding, and voiced agreement with the above plans.  All questions were answered to her satisfaction. she is encouraged to contact clinic should she have any questions or concerns prior to her return visit.   Follow up plan: Return in about 6 months (around 02/27/2022) for F/U with Pre-visit Labs.  Glade Lloyd, MD Phone: [phone]  Fax: [phone]   -  This note was partially dictated with voice recognition software. Similar sounding words can be transcribed inadequately or may not  be corrected upon review.  08/30/2021, 12:53 PM

## 2021-09-11 DIAGNOSIS — J984 Other disorders of lung: Secondary | ICD-10-CM | POA: Diagnosis not present

## 2021-09-11 DIAGNOSIS — D3502 Benign neoplasm of left adrenal gland: Secondary | ICD-10-CM | POA: Diagnosis not present

## 2021-09-11 DIAGNOSIS — D3501 Benign neoplasm of right adrenal gland: Secondary | ICD-10-CM | POA: Diagnosis not present

## 2021-09-11 DIAGNOSIS — R69 Illness, unspecified: Secondary | ICD-10-CM | POA: Diagnosis not present

## 2021-09-11 DIAGNOSIS — Z8673 Personal history of transient ischemic attack (TIA), and cerebral infarction without residual deficits: Secondary | ICD-10-CM | POA: Diagnosis not present

## 2021-09-11 DIAGNOSIS — I77810 Thoracic aortic ectasia: Secondary | ICD-10-CM | POA: Diagnosis not present

## 2021-09-11 DIAGNOSIS — M25511 Pain in right shoulder: Secondary | ICD-10-CM | POA: Diagnosis not present

## 2021-09-11 DIAGNOSIS — M13811 Other specified arthritis, right shoulder: Secondary | ICD-10-CM | POA: Diagnosis not present

## 2021-09-11 DIAGNOSIS — J449 Chronic obstructive pulmonary disease, unspecified: Secondary | ICD-10-CM | POA: Diagnosis not present

## 2021-09-11 DIAGNOSIS — I10 Essential (primary) hypertension: Secondary | ICD-10-CM | POA: Diagnosis not present

## 2021-09-11 DIAGNOSIS — R918 Other nonspecific abnormal finding of lung field: Secondary | ICD-10-CM | POA: Diagnosis not present

## 2021-09-11 DIAGNOSIS — R079 Chest pain, unspecified: Secondary | ICD-10-CM | POA: Diagnosis not present

## 2021-09-11 DIAGNOSIS — R911 Solitary pulmonary nodule: Secondary | ICD-10-CM | POA: Diagnosis not present

## 2021-09-11 DIAGNOSIS — M19011 Primary osteoarthritis, right shoulder: Secondary | ICD-10-CM | POA: Diagnosis not present

## 2021-09-13 DIAGNOSIS — C3481 Malignant neoplasm of overlapping sites of right bronchus and lung: Secondary | ICD-10-CM | POA: Diagnosis not present

## 2021-09-13 DIAGNOSIS — Z6823 Body mass index (BMI) 23.0-23.9, adult: Secondary | ICD-10-CM | POA: Diagnosis not present

## 2021-09-14 ENCOUNTER — Other Ambulatory Visit (HOSPITAL_COMMUNITY): Payer: Self-pay | Admitting: Internal Medicine

## 2021-09-14 ENCOUNTER — Other Ambulatory Visit: Payer: Self-pay | Admitting: Internal Medicine

## 2021-09-14 DIAGNOSIS — C3481 Malignant neoplasm of overlapping sites of right bronchus and lung: Secondary | ICD-10-CM

## 2021-09-15 DIAGNOSIS — Z48812 Encounter for surgical aftercare following surgery on the circulatory system: Secondary | ICD-10-CM | POA: Diagnosis not present

## 2021-09-15 DIAGNOSIS — I739 Peripheral vascular disease, unspecified: Secondary | ICD-10-CM | POA: Diagnosis not present

## 2021-09-21 DIAGNOSIS — I739 Peripheral vascular disease, unspecified: Secondary | ICD-10-CM | POA: Diagnosis not present

## 2021-09-21 DIAGNOSIS — I6522 Occlusion and stenosis of left carotid artery: Secondary | ICD-10-CM | POA: Diagnosis not present

## 2021-09-21 DIAGNOSIS — I7143 Infrarenal abdominal aortic aneurysm, without rupture: Secondary | ICD-10-CM | POA: Diagnosis not present

## 2021-09-21 DIAGNOSIS — Z48812 Encounter for surgical aftercare following surgery on the circulatory system: Secondary | ICD-10-CM | POA: Diagnosis not present

## 2021-09-27 ENCOUNTER — Other Ambulatory Visit: Payer: Self-pay

## 2021-09-27 ENCOUNTER — Encounter (HOSPITAL_COMMUNITY)
Admission: RE | Admit: 2021-09-27 | Discharge: 2021-09-27 | Disposition: A | Discharge status: Home / Self Care | Payer: Medicare HMO | Source: Ambulatory Visit | Attending: Internal Medicine | Admitting: Internal Medicine

## 2021-09-27 DIAGNOSIS — C3481 Malignant neoplasm of overlapping sites of right bronchus and lung: Secondary | ICD-10-CM | POA: Diagnosis not present

## 2021-09-27 DIAGNOSIS — C7951 Secondary malignant neoplasm of bone: Secondary | ICD-10-CM | POA: Diagnosis not present

## 2021-09-27 DIAGNOSIS — C3490 Malignant neoplasm of unspecified part of unspecified bronchus or lung: Secondary | ICD-10-CM | POA: Diagnosis not present

## 2021-09-27 DIAGNOSIS — R918 Other nonspecific abnormal finding of lung field: Secondary | ICD-10-CM | POA: Diagnosis not present

## 2021-09-27 DIAGNOSIS — D3500 Benign neoplasm of unspecified adrenal gland: Secondary | ICD-10-CM | POA: Diagnosis not present

## 2021-09-27 LAB — GLUCOSE, CAPILLARY: Glucose-Capillary: 98 mg/dL (ref 70–99)

## 2021-09-27 MED ORDER — FLUDEOXYGLUCOSE F - 18 (FDG) INJECTION
6.8600 | Freq: Once | INTRAVENOUS | Status: AC
  Administered 2021-09-27: 6.86 via INTRAVENOUS

## 2021-09-29 DIAGNOSIS — Z7901 Long term (current) use of anticoagulants: Secondary | ICD-10-CM | POA: Diagnosis not present

## 2021-09-29 DIAGNOSIS — R918 Other nonspecific abnormal finding of lung field: Secondary | ICD-10-CM | POA: Diagnosis not present

## 2021-10-10 DIAGNOSIS — C7951 Secondary malignant neoplasm of bone: Secondary | ICD-10-CM | POA: Diagnosis not present

## 2021-10-10 DIAGNOSIS — M545 Low back pain, unspecified: Secondary | ICD-10-CM | POA: Diagnosis not present

## 2021-10-10 DIAGNOSIS — M549 Dorsalgia, unspecified: Secondary | ICD-10-CM | POA: Diagnosis not present

## 2021-10-10 DIAGNOSIS — M8458XA Pathological fracture in neoplastic disease, other specified site, initial encounter for fracture: Secondary | ICD-10-CM | POA: Diagnosis not present

## 2021-10-10 DIAGNOSIS — R918 Other nonspecific abnormal finding of lung field: Secondary | ICD-10-CM | POA: Diagnosis not present

## 2021-10-12 DIAGNOSIS — E278 Other specified disorders of adrenal gland: Secondary | ICD-10-CM | POA: Diagnosis not present

## 2021-10-12 DIAGNOSIS — Z801 Family history of malignant neoplasm of trachea, bronchus and lung: Secondary | ICD-10-CM | POA: Diagnosis not present

## 2021-10-12 DIAGNOSIS — C7952 Secondary malignant neoplasm of bone marrow: Secondary | ICD-10-CM | POA: Diagnosis not present

## 2021-10-12 DIAGNOSIS — F17200 Nicotine dependence, unspecified, uncomplicated: Secondary | ICD-10-CM | POA: Diagnosis not present

## 2021-10-12 DIAGNOSIS — Z510 Encounter for antineoplastic radiation therapy: Secondary | ICD-10-CM | POA: Diagnosis not present

## 2021-10-12 DIAGNOSIS — I739 Peripheral vascular disease, unspecified: Secondary | ICD-10-CM | POA: Diagnosis not present

## 2021-10-12 DIAGNOSIS — M546 Pain in thoracic spine: Secondary | ICD-10-CM | POA: Diagnosis not present

## 2021-10-12 DIAGNOSIS — J449 Chronic obstructive pulmonary disease, unspecified: Secondary | ICD-10-CM | POA: Diagnosis not present

## 2021-10-12 DIAGNOSIS — I2541 Coronary artery aneurysm: Secondary | ICD-10-CM | POA: Diagnosis not present

## 2021-10-12 DIAGNOSIS — Z808 Family history of malignant neoplasm of other organs or systems: Secondary | ICD-10-CM | POA: Diagnosis not present

## 2021-10-12 DIAGNOSIS — C3411 Malignant neoplasm of upper lobe, right bronchus or lung: Secondary | ICD-10-CM | POA: Diagnosis not present

## 2021-10-12 DIAGNOSIS — I10 Essential (primary) hypertension: Secondary | ICD-10-CM | POA: Diagnosis not present

## 2021-10-12 DIAGNOSIS — S22020A Wedge compression fracture of second thoracic vertebra, initial encounter for closed fracture: Secondary | ICD-10-CM | POA: Diagnosis not present

## 2021-10-12 DIAGNOSIS — C7951 Secondary malignant neoplasm of bone: Secondary | ICD-10-CM | POA: Diagnosis not present

## 2021-10-12 DIAGNOSIS — E7800 Pure hypercholesterolemia, unspecified: Secondary | ICD-10-CM | POA: Diagnosis not present

## 2021-10-13 DIAGNOSIS — F17200 Nicotine dependence, unspecified, uncomplicated: Secondary | ICD-10-CM | POA: Diagnosis not present

## 2021-10-13 DIAGNOSIS — C7952 Secondary malignant neoplasm of bone marrow: Secondary | ICD-10-CM | POA: Diagnosis not present

## 2021-10-13 DIAGNOSIS — Z510 Encounter for antineoplastic radiation therapy: Secondary | ICD-10-CM | POA: Diagnosis not present

## 2021-10-13 DIAGNOSIS — M546 Pain in thoracic spine: Secondary | ICD-10-CM | POA: Diagnosis not present

## 2021-10-13 DIAGNOSIS — E7800 Pure hypercholesterolemia, unspecified: Secondary | ICD-10-CM | POA: Diagnosis not present

## 2021-10-13 DIAGNOSIS — S22020A Wedge compression fracture of second thoracic vertebra, initial encounter for closed fracture: Secondary | ICD-10-CM | POA: Diagnosis not present

## 2021-10-13 DIAGNOSIS — Z801 Family history of malignant neoplasm of trachea, bronchus and lung: Secondary | ICD-10-CM | POA: Diagnosis not present

## 2021-10-13 DIAGNOSIS — I10 Essential (primary) hypertension: Secondary | ICD-10-CM | POA: Diagnosis not present

## 2021-10-13 DIAGNOSIS — I739 Peripheral vascular disease, unspecified: Secondary | ICD-10-CM | POA: Diagnosis not present

## 2021-10-13 DIAGNOSIS — I2541 Coronary artery aneurysm: Secondary | ICD-10-CM | POA: Diagnosis not present

## 2021-10-13 DIAGNOSIS — Z808 Family history of malignant neoplasm of other organs or systems: Secondary | ICD-10-CM | POA: Diagnosis not present

## 2021-10-13 DIAGNOSIS — J449 Chronic obstructive pulmonary disease, unspecified: Secondary | ICD-10-CM | POA: Diagnosis not present

## 2021-10-13 DIAGNOSIS — E278 Other specified disorders of adrenal gland: Secondary | ICD-10-CM | POA: Diagnosis not present

## 2021-10-13 DIAGNOSIS — C3411 Malignant neoplasm of upper lobe, right bronchus or lung: Secondary | ICD-10-CM | POA: Diagnosis not present

## 2021-10-13 DIAGNOSIS — C7951 Secondary malignant neoplasm of bone: Secondary | ICD-10-CM | POA: Diagnosis not present

## 2021-10-20 DIAGNOSIS — Z8673 Personal history of transient ischemic attack (TIA), and cerebral infarction without residual deficits: Secondary | ICD-10-CM | POA: Diagnosis not present

## 2021-10-20 DIAGNOSIS — Z20822 Contact with and (suspected) exposure to covid-19: Secondary | ICD-10-CM | POA: Diagnosis not present

## 2021-10-20 DIAGNOSIS — Z7982 Long term (current) use of aspirin: Secondary | ICD-10-CM | POA: Diagnosis not present

## 2021-10-20 DIAGNOSIS — R0600 Dyspnea, unspecified: Secondary | ICD-10-CM | POA: Diagnosis not present

## 2021-10-20 DIAGNOSIS — Z4682 Encounter for fitting and adjustment of non-vascular catheter: Secondary | ICD-10-CM | POA: Diagnosis not present

## 2021-10-20 DIAGNOSIS — R918 Other nonspecific abnormal finding of lung field: Secondary | ICD-10-CM | POA: Diagnosis not present

## 2021-10-20 DIAGNOSIS — R0902 Hypoxemia: Secondary | ICD-10-CM | POA: Diagnosis not present

## 2021-10-20 DIAGNOSIS — Z9660 Presence of unspecified orthopedic joint implant: Secondary | ICD-10-CM | POA: Diagnosis not present

## 2021-10-20 DIAGNOSIS — F17210 Nicotine dependence, cigarettes, uncomplicated: Secondary | ICD-10-CM | POA: Diagnosis not present

## 2021-10-20 DIAGNOSIS — J939 Pneumothorax, unspecified: Secondary | ICD-10-CM | POA: Diagnosis not present

## 2021-10-20 DIAGNOSIS — C7951 Secondary malignant neoplasm of bone: Secondary | ICD-10-CM | POA: Diagnosis not present

## 2021-10-20 DIAGNOSIS — C3490 Malignant neoplasm of unspecified part of unspecified bronchus or lung: Secondary | ICD-10-CM | POA: Diagnosis not present

## 2021-10-20 DIAGNOSIS — J449 Chronic obstructive pulmonary disease, unspecified: Secondary | ICD-10-CM | POA: Diagnosis not present

## 2021-10-20 DIAGNOSIS — Z7902 Long term (current) use of antithrombotics/antiplatelets: Secondary | ICD-10-CM | POA: Diagnosis not present

## 2021-10-20 DIAGNOSIS — I714 Abdominal aortic aneurysm, without rupture, unspecified: Secondary | ICD-10-CM | POA: Diagnosis not present

## 2021-10-20 DIAGNOSIS — Z9049 Acquired absence of other specified parts of digestive tract: Secondary | ICD-10-CM | POA: Diagnosis not present

## 2021-10-20 DIAGNOSIS — Z880 Allergy status to penicillin: Secondary | ICD-10-CM | POA: Diagnosis not present

## 2021-10-20 DIAGNOSIS — R911 Solitary pulmonary nodule: Secondary | ICD-10-CM | POA: Diagnosis not present

## 2021-10-20 DIAGNOSIS — C3411 Malignant neoplasm of upper lobe, right bronchus or lung: Secondary | ICD-10-CM | POA: Diagnosis not present

## 2021-10-20 DIAGNOSIS — E039 Hypothyroidism, unspecified: Secondary | ICD-10-CM | POA: Diagnosis not present

## 2021-10-20 DIAGNOSIS — I739 Peripheral vascular disease, unspecified: Secondary | ICD-10-CM | POA: Diagnosis not present

## 2021-10-20 DIAGNOSIS — E7800 Pure hypercholesterolemia, unspecified: Secondary | ICD-10-CM | POA: Diagnosis not present

## 2021-10-20 DIAGNOSIS — J95811 Postprocedural pneumothorax: Secondary | ICD-10-CM | POA: Diagnosis not present

## 2021-10-20 DIAGNOSIS — R69 Illness, unspecified: Secondary | ICD-10-CM | POA: Diagnosis not present

## 2021-10-20 DIAGNOSIS — I10 Essential (primary) hypertension: Secondary | ICD-10-CM | POA: Diagnosis not present

## 2021-10-20 DIAGNOSIS — Z716 Tobacco abuse counseling: Secondary | ICD-10-CM | POA: Diagnosis not present

## 2021-10-20 DIAGNOSIS — Z801 Family history of malignant neoplasm of trachea, bronchus and lung: Secondary | ICD-10-CM | POA: Diagnosis not present

## 2021-10-20 DIAGNOSIS — Z90710 Acquired absence of both cervix and uterus: Secondary | ICD-10-CM | POA: Diagnosis not present

## 2021-10-20 DIAGNOSIS — Z79899 Other long term (current) drug therapy: Secondary | ICD-10-CM | POA: Diagnosis not present

## 2021-10-21 DIAGNOSIS — J449 Chronic obstructive pulmonary disease, unspecified: Secondary | ICD-10-CM | POA: Diagnosis not present

## 2021-10-21 DIAGNOSIS — J939 Pneumothorax, unspecified: Secondary | ICD-10-CM | POA: Diagnosis not present

## 2021-10-21 DIAGNOSIS — I10 Essential (primary) hypertension: Secondary | ICD-10-CM | POA: Diagnosis not present

## 2021-10-21 DIAGNOSIS — C3490 Malignant neoplasm of unspecified part of unspecified bronchus or lung: Secondary | ICD-10-CM | POA: Diagnosis not present

## 2021-10-21 DIAGNOSIS — J95811 Postprocedural pneumothorax: Secondary | ICD-10-CM | POA: Diagnosis not present

## 2021-10-21 DIAGNOSIS — R69 Illness, unspecified: Secondary | ICD-10-CM | POA: Diagnosis not present

## 2021-10-21 DIAGNOSIS — R0600 Dyspnea, unspecified: Secondary | ICD-10-CM | POA: Diagnosis not present

## 2021-10-21 DIAGNOSIS — Z7982 Long term (current) use of aspirin: Secondary | ICD-10-CM | POA: Diagnosis not present

## 2021-10-21 DIAGNOSIS — C7951 Secondary malignant neoplasm of bone: Secondary | ICD-10-CM | POA: Diagnosis not present

## 2021-10-21 DIAGNOSIS — Z801 Family history of malignant neoplasm of trachea, bronchus and lung: Secondary | ICD-10-CM | POA: Diagnosis not present

## 2021-10-21 DIAGNOSIS — E7800 Pure hypercholesterolemia, unspecified: Secondary | ICD-10-CM | POA: Diagnosis not present

## 2021-10-21 DIAGNOSIS — C3411 Malignant neoplasm of upper lobe, right bronchus or lung: Secondary | ICD-10-CM | POA: Diagnosis not present

## 2021-10-21 DIAGNOSIS — R918 Other nonspecific abnormal finding of lung field: Secondary | ICD-10-CM | POA: Diagnosis not present

## 2021-10-21 DIAGNOSIS — R0902 Hypoxemia: Secondary | ICD-10-CM | POA: Diagnosis not present

## 2021-10-24 ENCOUNTER — Ambulatory Visit (INDEPENDENT_AMBULATORY_CARE_PROVIDER_SITE_OTHER): Payer: Medicare HMO | Admitting: Gastroenterology

## 2021-10-24 DIAGNOSIS — C7951 Secondary malignant neoplasm of bone: Secondary | ICD-10-CM | POA: Diagnosis not present

## 2021-10-24 DIAGNOSIS — Z801 Family history of malignant neoplasm of trachea, bronchus and lung: Secondary | ICD-10-CM | POA: Diagnosis not present

## 2021-10-24 DIAGNOSIS — C801 Malignant (primary) neoplasm, unspecified: Secondary | ICD-10-CM | POA: Diagnosis not present

## 2021-10-24 DIAGNOSIS — I2541 Coronary artery aneurysm: Secondary | ICD-10-CM | POA: Diagnosis not present

## 2021-10-24 DIAGNOSIS — J449 Chronic obstructive pulmonary disease, unspecified: Secondary | ICD-10-CM | POA: Diagnosis not present

## 2021-10-24 DIAGNOSIS — C7949 Secondary malignant neoplasm of other parts of nervous system: Secondary | ICD-10-CM | POA: Diagnosis not present

## 2021-10-24 DIAGNOSIS — I10 Essential (primary) hypertension: Secondary | ICD-10-CM | POA: Diagnosis not present

## 2021-10-24 DIAGNOSIS — E278 Other specified disorders of adrenal gland: Secondary | ICD-10-CM | POA: Diagnosis not present

## 2021-10-24 DIAGNOSIS — R918 Other nonspecific abnormal finding of lung field: Secondary | ICD-10-CM | POA: Diagnosis not present

## 2021-10-24 DIAGNOSIS — S22020A Wedge compression fracture of second thoracic vertebra, initial encounter for closed fracture: Secondary | ICD-10-CM | POA: Diagnosis not present

## 2021-10-24 DIAGNOSIS — C7952 Secondary malignant neoplasm of bone marrow: Secondary | ICD-10-CM | POA: Diagnosis not present

## 2021-10-24 DIAGNOSIS — C3411 Malignant neoplasm of upper lobe, right bronchus or lung: Secondary | ICD-10-CM | POA: Diagnosis not present

## 2021-10-24 DIAGNOSIS — E7800 Pure hypercholesterolemia, unspecified: Secondary | ICD-10-CM | POA: Diagnosis not present

## 2021-10-24 DIAGNOSIS — F17200 Nicotine dependence, unspecified, uncomplicated: Secondary | ICD-10-CM | POA: Diagnosis not present

## 2021-10-24 DIAGNOSIS — Z510 Encounter for antineoplastic radiation therapy: Secondary | ICD-10-CM | POA: Diagnosis not present

## 2021-10-24 DIAGNOSIS — M546 Pain in thoracic spine: Secondary | ICD-10-CM | POA: Diagnosis not present

## 2021-10-24 DIAGNOSIS — I739 Peripheral vascular disease, unspecified: Secondary | ICD-10-CM | POA: Diagnosis not present

## 2021-10-24 DIAGNOSIS — Z808 Family history of malignant neoplasm of other organs or systems: Secondary | ICD-10-CM | POA: Diagnosis not present

## 2021-10-24 DIAGNOSIS — C3491 Malignant neoplasm of unspecified part of right bronchus or lung: Secondary | ICD-10-CM | POA: Diagnosis not present

## 2021-10-25 DIAGNOSIS — I10 Essential (primary) hypertension: Secondary | ICD-10-CM | POA: Diagnosis not present

## 2021-10-25 DIAGNOSIS — F17200 Nicotine dependence, unspecified, uncomplicated: Secondary | ICD-10-CM | POA: Diagnosis not present

## 2021-10-25 DIAGNOSIS — C3411 Malignant neoplasm of upper lobe, right bronchus or lung: Secondary | ICD-10-CM | POA: Diagnosis not present

## 2021-10-25 DIAGNOSIS — C7952 Secondary malignant neoplasm of bone marrow: Secondary | ICD-10-CM | POA: Diagnosis not present

## 2021-10-25 DIAGNOSIS — S22020A Wedge compression fracture of second thoracic vertebra, initial encounter for closed fracture: Secondary | ICD-10-CM | POA: Diagnosis not present

## 2021-10-25 DIAGNOSIS — Z808 Family history of malignant neoplasm of other organs or systems: Secondary | ICD-10-CM | POA: Diagnosis not present

## 2021-10-25 DIAGNOSIS — C7951 Secondary malignant neoplasm of bone: Secondary | ICD-10-CM | POA: Diagnosis not present

## 2021-10-25 DIAGNOSIS — M546 Pain in thoracic spine: Secondary | ICD-10-CM | POA: Diagnosis not present

## 2021-10-25 DIAGNOSIS — E7800 Pure hypercholesterolemia, unspecified: Secondary | ICD-10-CM | POA: Diagnosis not present

## 2021-10-25 DIAGNOSIS — Z510 Encounter for antineoplastic radiation therapy: Secondary | ICD-10-CM | POA: Diagnosis not present

## 2021-10-25 DIAGNOSIS — J449 Chronic obstructive pulmonary disease, unspecified: Secondary | ICD-10-CM | POA: Diagnosis not present

## 2021-10-25 DIAGNOSIS — E278 Other specified disorders of adrenal gland: Secondary | ICD-10-CM | POA: Diagnosis not present

## 2021-10-25 DIAGNOSIS — I739 Peripheral vascular disease, unspecified: Secondary | ICD-10-CM | POA: Diagnosis not present

## 2021-10-25 DIAGNOSIS — I2541 Coronary artery aneurysm: Secondary | ICD-10-CM | POA: Diagnosis not present

## 2021-10-25 DIAGNOSIS — Z801 Family history of malignant neoplasm of trachea, bronchus and lung: Secondary | ICD-10-CM | POA: Diagnosis not present

## 2021-10-26 DIAGNOSIS — C3491 Malignant neoplasm of unspecified part of right bronchus or lung: Secondary | ICD-10-CM | POA: Diagnosis not present

## 2021-10-26 DIAGNOSIS — F17200 Nicotine dependence, unspecified, uncomplicated: Secondary | ICD-10-CM | POA: Diagnosis not present

## 2021-10-26 DIAGNOSIS — J449 Chronic obstructive pulmonary disease, unspecified: Secondary | ICD-10-CM | POA: Diagnosis not present

## 2021-10-26 DIAGNOSIS — I10 Essential (primary) hypertension: Secondary | ICD-10-CM | POA: Diagnosis not present

## 2021-10-26 DIAGNOSIS — C3411 Malignant neoplasm of upper lobe, right bronchus or lung: Secondary | ICD-10-CM | POA: Diagnosis not present

## 2021-10-26 DIAGNOSIS — M546 Pain in thoracic spine: Secondary | ICD-10-CM | POA: Diagnosis not present

## 2021-10-26 DIAGNOSIS — E278 Other specified disorders of adrenal gland: Secondary | ICD-10-CM | POA: Diagnosis not present

## 2021-10-26 DIAGNOSIS — Z801 Family history of malignant neoplasm of trachea, bronchus and lung: Secondary | ICD-10-CM | POA: Diagnosis not present

## 2021-10-26 DIAGNOSIS — Z510 Encounter for antineoplastic radiation therapy: Secondary | ICD-10-CM | POA: Diagnosis not present

## 2021-10-26 DIAGNOSIS — S22020A Wedge compression fracture of second thoracic vertebra, initial encounter for closed fracture: Secondary | ICD-10-CM | POA: Diagnosis not present

## 2021-10-26 DIAGNOSIS — I2541 Coronary artery aneurysm: Secondary | ICD-10-CM | POA: Diagnosis not present

## 2021-10-26 DIAGNOSIS — C7952 Secondary malignant neoplasm of bone marrow: Secondary | ICD-10-CM | POA: Diagnosis not present

## 2021-10-26 DIAGNOSIS — Z808 Family history of malignant neoplasm of other organs or systems: Secondary | ICD-10-CM | POA: Diagnosis not present

## 2021-10-26 DIAGNOSIS — E7800 Pure hypercholesterolemia, unspecified: Secondary | ICD-10-CM | POA: Diagnosis not present

## 2021-10-26 DIAGNOSIS — I739 Peripheral vascular disease, unspecified: Secondary | ICD-10-CM | POA: Diagnosis not present

## 2021-10-26 DIAGNOSIS — C7951 Secondary malignant neoplasm of bone: Secondary | ICD-10-CM | POA: Diagnosis not present

## 2021-10-27 DIAGNOSIS — Z510 Encounter for antineoplastic radiation therapy: Secondary | ICD-10-CM | POA: Diagnosis not present

## 2021-10-27 DIAGNOSIS — I10 Essential (primary) hypertension: Secondary | ICD-10-CM | POA: Diagnosis not present

## 2021-10-27 DIAGNOSIS — C3491 Malignant neoplasm of unspecified part of right bronchus or lung: Secondary | ICD-10-CM | POA: Diagnosis not present

## 2021-10-27 DIAGNOSIS — J449 Chronic obstructive pulmonary disease, unspecified: Secondary | ICD-10-CM | POA: Diagnosis not present

## 2021-10-27 DIAGNOSIS — S22020A Wedge compression fracture of second thoracic vertebra, initial encounter for closed fracture: Secondary | ICD-10-CM | POA: Diagnosis not present

## 2021-10-27 DIAGNOSIS — E7800 Pure hypercholesterolemia, unspecified: Secondary | ICD-10-CM | POA: Diagnosis not present

## 2021-10-27 DIAGNOSIS — Z801 Family history of malignant neoplasm of trachea, bronchus and lung: Secondary | ICD-10-CM | POA: Diagnosis not present

## 2021-10-27 DIAGNOSIS — F17200 Nicotine dependence, unspecified, uncomplicated: Secondary | ICD-10-CM | POA: Diagnosis not present

## 2021-10-27 DIAGNOSIS — Z808 Family history of malignant neoplasm of other organs or systems: Secondary | ICD-10-CM | POA: Diagnosis not present

## 2021-10-27 DIAGNOSIS — C7951 Secondary malignant neoplasm of bone: Secondary | ICD-10-CM | POA: Diagnosis not present

## 2021-10-27 DIAGNOSIS — I739 Peripheral vascular disease, unspecified: Secondary | ICD-10-CM | POA: Diagnosis not present

## 2021-10-27 DIAGNOSIS — M546 Pain in thoracic spine: Secondary | ICD-10-CM | POA: Diagnosis not present

## 2021-10-27 DIAGNOSIS — C3411 Malignant neoplasm of upper lobe, right bronchus or lung: Secondary | ICD-10-CM | POA: Diagnosis not present

## 2021-10-27 DIAGNOSIS — I2541 Coronary artery aneurysm: Secondary | ICD-10-CM | POA: Diagnosis not present

## 2021-10-27 DIAGNOSIS — E278 Other specified disorders of adrenal gland: Secondary | ICD-10-CM | POA: Diagnosis not present

## 2021-10-27 DIAGNOSIS — C7952 Secondary malignant neoplasm of bone marrow: Secondary | ICD-10-CM | POA: Diagnosis not present

## 2021-10-28 DIAGNOSIS — S22020A Wedge compression fracture of second thoracic vertebra, initial encounter for closed fracture: Secondary | ICD-10-CM | POA: Diagnosis not present

## 2021-10-28 DIAGNOSIS — C3491 Malignant neoplasm of unspecified part of right bronchus or lung: Secondary | ICD-10-CM | POA: Diagnosis not present

## 2021-10-28 DIAGNOSIS — Z510 Encounter for antineoplastic radiation therapy: Secondary | ICD-10-CM | POA: Diagnosis not present

## 2021-10-28 DIAGNOSIS — I2541 Coronary artery aneurysm: Secondary | ICD-10-CM | POA: Diagnosis not present

## 2021-10-28 DIAGNOSIS — M546 Pain in thoracic spine: Secondary | ICD-10-CM | POA: Diagnosis not present

## 2021-10-28 DIAGNOSIS — F17200 Nicotine dependence, unspecified, uncomplicated: Secondary | ICD-10-CM | POA: Diagnosis not present

## 2021-10-28 DIAGNOSIS — J449 Chronic obstructive pulmonary disease, unspecified: Secondary | ICD-10-CM | POA: Diagnosis not present

## 2021-10-28 DIAGNOSIS — E278 Other specified disorders of adrenal gland: Secondary | ICD-10-CM | POA: Diagnosis not present

## 2021-10-28 DIAGNOSIS — Z808 Family history of malignant neoplasm of other organs or systems: Secondary | ICD-10-CM | POA: Diagnosis not present

## 2021-10-28 DIAGNOSIS — C7951 Secondary malignant neoplasm of bone: Secondary | ICD-10-CM | POA: Diagnosis not present

## 2021-10-28 DIAGNOSIS — E7800 Pure hypercholesterolemia, unspecified: Secondary | ICD-10-CM | POA: Diagnosis not present

## 2021-10-28 DIAGNOSIS — I10 Essential (primary) hypertension: Secondary | ICD-10-CM | POA: Diagnosis not present

## 2021-10-28 DIAGNOSIS — C3411 Malignant neoplasm of upper lobe, right bronchus or lung: Secondary | ICD-10-CM | POA: Diagnosis not present

## 2021-10-28 DIAGNOSIS — I739 Peripheral vascular disease, unspecified: Secondary | ICD-10-CM | POA: Diagnosis not present

## 2021-10-28 DIAGNOSIS — C7952 Secondary malignant neoplasm of bone marrow: Secondary | ICD-10-CM | POA: Diagnosis not present

## 2021-10-28 DIAGNOSIS — Z801 Family history of malignant neoplasm of trachea, bronchus and lung: Secondary | ICD-10-CM | POA: Diagnosis not present

## 2021-10-31 DIAGNOSIS — I739 Peripheral vascular disease, unspecified: Secondary | ICD-10-CM | POA: Diagnosis not present

## 2021-10-31 DIAGNOSIS — C3411 Malignant neoplasm of upper lobe, right bronchus or lung: Secondary | ICD-10-CM | POA: Diagnosis not present

## 2021-10-31 DIAGNOSIS — Z801 Family history of malignant neoplasm of trachea, bronchus and lung: Secondary | ICD-10-CM | POA: Diagnosis not present

## 2021-10-31 DIAGNOSIS — Z510 Encounter for antineoplastic radiation therapy: Secondary | ICD-10-CM | POA: Diagnosis not present

## 2021-10-31 DIAGNOSIS — F17200 Nicotine dependence, unspecified, uncomplicated: Secondary | ICD-10-CM | POA: Diagnosis not present

## 2021-10-31 DIAGNOSIS — I10 Essential (primary) hypertension: Secondary | ICD-10-CM | POA: Diagnosis not present

## 2021-10-31 DIAGNOSIS — S22020A Wedge compression fracture of second thoracic vertebra, initial encounter for closed fracture: Secondary | ICD-10-CM | POA: Diagnosis not present

## 2021-10-31 DIAGNOSIS — E7800 Pure hypercholesterolemia, unspecified: Secondary | ICD-10-CM | POA: Diagnosis not present

## 2021-10-31 DIAGNOSIS — C7951 Secondary malignant neoplasm of bone: Secondary | ICD-10-CM | POA: Diagnosis not present

## 2021-10-31 DIAGNOSIS — C3491 Malignant neoplasm of unspecified part of right bronchus or lung: Secondary | ICD-10-CM | POA: Diagnosis not present

## 2021-10-31 DIAGNOSIS — C7952 Secondary malignant neoplasm of bone marrow: Secondary | ICD-10-CM | POA: Diagnosis not present

## 2021-10-31 DIAGNOSIS — I2541 Coronary artery aneurysm: Secondary | ICD-10-CM | POA: Diagnosis not present

## 2021-10-31 DIAGNOSIS — Z808 Family history of malignant neoplasm of other organs or systems: Secondary | ICD-10-CM | POA: Diagnosis not present

## 2021-10-31 DIAGNOSIS — E278 Other specified disorders of adrenal gland: Secondary | ICD-10-CM | POA: Diagnosis not present

## 2021-10-31 DIAGNOSIS — J449 Chronic obstructive pulmonary disease, unspecified: Secondary | ICD-10-CM | POA: Diagnosis not present

## 2021-10-31 DIAGNOSIS — M546 Pain in thoracic spine: Secondary | ICD-10-CM | POA: Diagnosis not present

## 2021-11-01 DIAGNOSIS — C3411 Malignant neoplasm of upper lobe, right bronchus or lung: Secondary | ICD-10-CM | POA: Diagnosis not present

## 2021-11-01 DIAGNOSIS — I739 Peripheral vascular disease, unspecified: Secondary | ICD-10-CM | POA: Diagnosis not present

## 2021-11-01 DIAGNOSIS — C7951 Secondary malignant neoplasm of bone: Secondary | ICD-10-CM | POA: Diagnosis not present

## 2021-11-01 DIAGNOSIS — E278 Other specified disorders of adrenal gland: Secondary | ICD-10-CM | POA: Diagnosis not present

## 2021-11-01 DIAGNOSIS — E7800 Pure hypercholesterolemia, unspecified: Secondary | ICD-10-CM | POA: Diagnosis not present

## 2021-11-01 DIAGNOSIS — C3490 Malignant neoplasm of unspecified part of unspecified bronchus or lung: Secondary | ICD-10-CM | POA: Diagnosis not present

## 2021-11-01 DIAGNOSIS — Z808 Family history of malignant neoplasm of other organs or systems: Secondary | ICD-10-CM | POA: Diagnosis not present

## 2021-11-01 DIAGNOSIS — C3491 Malignant neoplasm of unspecified part of right bronchus or lung: Secondary | ICD-10-CM | POA: Diagnosis not present

## 2021-11-01 DIAGNOSIS — I10 Essential (primary) hypertension: Secondary | ICD-10-CM | POA: Diagnosis not present

## 2021-11-01 DIAGNOSIS — F17200 Nicotine dependence, unspecified, uncomplicated: Secondary | ICD-10-CM | POA: Diagnosis not present

## 2021-11-01 DIAGNOSIS — J449 Chronic obstructive pulmonary disease, unspecified: Secondary | ICD-10-CM | POA: Diagnosis not present

## 2021-11-01 DIAGNOSIS — Z510 Encounter for antineoplastic radiation therapy: Secondary | ICD-10-CM | POA: Diagnosis not present

## 2021-11-01 DIAGNOSIS — C7952 Secondary malignant neoplasm of bone marrow: Secondary | ICD-10-CM | POA: Diagnosis not present

## 2021-11-01 DIAGNOSIS — I2541 Coronary artery aneurysm: Secondary | ICD-10-CM | POA: Diagnosis not present

## 2021-11-01 DIAGNOSIS — M546 Pain in thoracic spine: Secondary | ICD-10-CM | POA: Diagnosis not present

## 2021-11-01 DIAGNOSIS — Z801 Family history of malignant neoplasm of trachea, bronchus and lung: Secondary | ICD-10-CM | POA: Diagnosis not present

## 2021-11-01 DIAGNOSIS — S22020A Wedge compression fracture of second thoracic vertebra, initial encounter for closed fracture: Secondary | ICD-10-CM | POA: Diagnosis not present

## 2021-11-02 DIAGNOSIS — I2541 Coronary artery aneurysm: Secondary | ICD-10-CM | POA: Diagnosis not present

## 2021-11-02 DIAGNOSIS — Z808 Family history of malignant neoplasm of other organs or systems: Secondary | ICD-10-CM | POA: Diagnosis not present

## 2021-11-02 DIAGNOSIS — Z801 Family history of malignant neoplasm of trachea, bronchus and lung: Secondary | ICD-10-CM | POA: Diagnosis not present

## 2021-11-02 DIAGNOSIS — C3411 Malignant neoplasm of upper lobe, right bronchus or lung: Secondary | ICD-10-CM | POA: Diagnosis not present

## 2021-11-02 DIAGNOSIS — M546 Pain in thoracic spine: Secondary | ICD-10-CM | POA: Diagnosis not present

## 2021-11-02 DIAGNOSIS — I10 Essential (primary) hypertension: Secondary | ICD-10-CM | POA: Diagnosis not present

## 2021-11-02 DIAGNOSIS — C801 Malignant (primary) neoplasm, unspecified: Secondary | ICD-10-CM | POA: Diagnosis not present

## 2021-11-02 DIAGNOSIS — Z510 Encounter for antineoplastic radiation therapy: Secondary | ICD-10-CM | POA: Diagnosis not present

## 2021-11-02 DIAGNOSIS — C7952 Secondary malignant neoplasm of bone marrow: Secondary | ICD-10-CM | POA: Diagnosis not present

## 2021-11-02 DIAGNOSIS — C3491 Malignant neoplasm of unspecified part of right bronchus or lung: Secondary | ICD-10-CM | POA: Diagnosis not present

## 2021-11-02 DIAGNOSIS — E7800 Pure hypercholesterolemia, unspecified: Secondary | ICD-10-CM | POA: Diagnosis not present

## 2021-11-02 DIAGNOSIS — F17200 Nicotine dependence, unspecified, uncomplicated: Secondary | ICD-10-CM | POA: Diagnosis not present

## 2021-11-02 DIAGNOSIS — C7951 Secondary malignant neoplasm of bone: Secondary | ICD-10-CM | POA: Diagnosis not present

## 2021-11-02 DIAGNOSIS — S22020A Wedge compression fracture of second thoracic vertebra, initial encounter for closed fracture: Secondary | ICD-10-CM | POA: Diagnosis not present

## 2021-11-02 DIAGNOSIS — I739 Peripheral vascular disease, unspecified: Secondary | ICD-10-CM | POA: Diagnosis not present

## 2021-11-02 DIAGNOSIS — E278 Other specified disorders of adrenal gland: Secondary | ICD-10-CM | POA: Diagnosis not present

## 2021-11-02 DIAGNOSIS — C7949 Secondary malignant neoplasm of other parts of nervous system: Secondary | ICD-10-CM | POA: Diagnosis not present

## 2021-11-02 DIAGNOSIS — J449 Chronic obstructive pulmonary disease, unspecified: Secondary | ICD-10-CM | POA: Diagnosis not present

## 2021-11-03 DIAGNOSIS — Z808 Family history of malignant neoplasm of other organs or systems: Secondary | ICD-10-CM | POA: Diagnosis not present

## 2021-11-03 DIAGNOSIS — C3490 Malignant neoplasm of unspecified part of unspecified bronchus or lung: Secondary | ICD-10-CM | POA: Diagnosis not present

## 2021-11-03 DIAGNOSIS — J449 Chronic obstructive pulmonary disease, unspecified: Secondary | ICD-10-CM | POA: Diagnosis not present

## 2021-11-03 DIAGNOSIS — E278 Other specified disorders of adrenal gland: Secondary | ICD-10-CM | POA: Diagnosis not present

## 2021-11-03 DIAGNOSIS — I10 Essential (primary) hypertension: Secondary | ICD-10-CM | POA: Diagnosis not present

## 2021-11-03 DIAGNOSIS — F17200 Nicotine dependence, unspecified, uncomplicated: Secondary | ICD-10-CM | POA: Diagnosis not present

## 2021-11-03 DIAGNOSIS — M546 Pain in thoracic spine: Secondary | ICD-10-CM | POA: Diagnosis not present

## 2021-11-03 DIAGNOSIS — C7952 Secondary malignant neoplasm of bone marrow: Secondary | ICD-10-CM | POA: Diagnosis not present

## 2021-11-03 DIAGNOSIS — E7800 Pure hypercholesterolemia, unspecified: Secondary | ICD-10-CM | POA: Diagnosis not present

## 2021-11-03 DIAGNOSIS — S22020A Wedge compression fracture of second thoracic vertebra, initial encounter for closed fracture: Secondary | ICD-10-CM | POA: Diagnosis not present

## 2021-11-03 DIAGNOSIS — C7951 Secondary malignant neoplasm of bone: Secondary | ICD-10-CM | POA: Diagnosis not present

## 2021-11-03 DIAGNOSIS — C3491 Malignant neoplasm of unspecified part of right bronchus or lung: Secondary | ICD-10-CM | POA: Diagnosis not present

## 2021-11-03 DIAGNOSIS — C3411 Malignant neoplasm of upper lobe, right bronchus or lung: Secondary | ICD-10-CM | POA: Diagnosis not present

## 2021-11-03 DIAGNOSIS — Z510 Encounter for antineoplastic radiation therapy: Secondary | ICD-10-CM | POA: Diagnosis not present

## 2021-11-03 DIAGNOSIS — I2541 Coronary artery aneurysm: Secondary | ICD-10-CM | POA: Diagnosis not present

## 2021-11-03 DIAGNOSIS — Z801 Family history of malignant neoplasm of trachea, bronchus and lung: Secondary | ICD-10-CM | POA: Diagnosis not present

## 2021-11-03 DIAGNOSIS — I739 Peripheral vascular disease, unspecified: Secondary | ICD-10-CM | POA: Diagnosis not present

## 2021-11-04 DIAGNOSIS — C7952 Secondary malignant neoplasm of bone marrow: Secondary | ICD-10-CM | POA: Diagnosis not present

## 2021-11-04 DIAGNOSIS — S22020A Wedge compression fracture of second thoracic vertebra, initial encounter for closed fracture: Secondary | ICD-10-CM | POA: Diagnosis not present

## 2021-11-04 DIAGNOSIS — E7800 Pure hypercholesterolemia, unspecified: Secondary | ICD-10-CM | POA: Diagnosis not present

## 2021-11-04 DIAGNOSIS — C3491 Malignant neoplasm of unspecified part of right bronchus or lung: Secondary | ICD-10-CM | POA: Diagnosis not present

## 2021-11-04 DIAGNOSIS — J449 Chronic obstructive pulmonary disease, unspecified: Secondary | ICD-10-CM | POA: Diagnosis not present

## 2021-11-04 DIAGNOSIS — F17200 Nicotine dependence, unspecified, uncomplicated: Secondary | ICD-10-CM | POA: Diagnosis not present

## 2021-11-04 DIAGNOSIS — C3411 Malignant neoplasm of upper lobe, right bronchus or lung: Secondary | ICD-10-CM | POA: Diagnosis not present

## 2021-11-04 DIAGNOSIS — Z801 Family history of malignant neoplasm of trachea, bronchus and lung: Secondary | ICD-10-CM | POA: Diagnosis not present

## 2021-11-04 DIAGNOSIS — I10 Essential (primary) hypertension: Secondary | ICD-10-CM | POA: Diagnosis not present

## 2021-11-04 DIAGNOSIS — Z808 Family history of malignant neoplasm of other organs or systems: Secondary | ICD-10-CM | POA: Diagnosis not present

## 2021-11-04 DIAGNOSIS — C7951 Secondary malignant neoplasm of bone: Secondary | ICD-10-CM | POA: Diagnosis not present

## 2021-11-04 DIAGNOSIS — M546 Pain in thoracic spine: Secondary | ICD-10-CM | POA: Diagnosis not present

## 2021-11-04 DIAGNOSIS — E278 Other specified disorders of adrenal gland: Secondary | ICD-10-CM | POA: Diagnosis not present

## 2021-11-04 DIAGNOSIS — I2541 Coronary artery aneurysm: Secondary | ICD-10-CM | POA: Diagnosis not present

## 2021-11-04 DIAGNOSIS — Z510 Encounter for antineoplastic radiation therapy: Secondary | ICD-10-CM | POA: Diagnosis not present

## 2021-11-04 DIAGNOSIS — I739 Peripheral vascular disease, unspecified: Secondary | ICD-10-CM | POA: Diagnosis not present

## 2021-11-07 DIAGNOSIS — C3490 Malignant neoplasm of unspecified part of unspecified bronchus or lung: Secondary | ICD-10-CM | POA: Diagnosis not present

## 2021-11-07 DIAGNOSIS — Z7982 Long term (current) use of aspirin: Secondary | ICD-10-CM | POA: Diagnosis not present

## 2021-11-07 DIAGNOSIS — I2510 Atherosclerotic heart disease of native coronary artery without angina pectoris: Secondary | ICD-10-CM | POA: Diagnosis not present

## 2021-11-07 DIAGNOSIS — E7800 Pure hypercholesterolemia, unspecified: Secondary | ICD-10-CM | POA: Diagnosis not present

## 2021-11-07 DIAGNOSIS — E039 Hypothyroidism, unspecified: Secondary | ICD-10-CM | POA: Diagnosis not present

## 2021-11-07 DIAGNOSIS — Z452 Encounter for adjustment and management of vascular access device: Secondary | ICD-10-CM | POA: Diagnosis not present

## 2021-11-07 DIAGNOSIS — C3411 Malignant neoplasm of upper lobe, right bronchus or lung: Secondary | ICD-10-CM | POA: Diagnosis not present

## 2021-11-07 DIAGNOSIS — R69 Illness, unspecified: Secondary | ICD-10-CM | POA: Diagnosis not present

## 2021-11-07 DIAGNOSIS — J449 Chronic obstructive pulmonary disease, unspecified: Secondary | ICD-10-CM | POA: Diagnosis not present

## 2021-11-07 DIAGNOSIS — I10 Essential (primary) hypertension: Secondary | ICD-10-CM | POA: Diagnosis not present

## 2021-11-07 DIAGNOSIS — C412 Malignant neoplasm of vertebral column: Secondary | ICD-10-CM | POA: Diagnosis not present

## 2021-11-07 DIAGNOSIS — Z9882 Breast implant status: Secondary | ICD-10-CM | POA: Diagnosis not present

## 2021-11-07 DIAGNOSIS — K219 Gastro-esophageal reflux disease without esophagitis: Secondary | ICD-10-CM | POA: Diagnosis not present

## 2021-11-07 DIAGNOSIS — C7951 Secondary malignant neoplasm of bone: Secondary | ICD-10-CM | POA: Diagnosis not present

## 2021-11-07 DIAGNOSIS — E063 Autoimmune thyroiditis: Secondary | ICD-10-CM | POA: Diagnosis not present

## 2021-11-08 DIAGNOSIS — I639 Cerebral infarction, unspecified: Secondary | ICD-10-CM | POA: Diagnosis not present

## 2021-11-08 DIAGNOSIS — C3481 Malignant neoplasm of overlapping sites of right bronchus and lung: Secondary | ICD-10-CM | POA: Diagnosis not present

## 2021-11-08 DIAGNOSIS — I6782 Cerebral ischemia: Secondary | ICD-10-CM | POA: Diagnosis not present

## 2021-11-08 DIAGNOSIS — I10 Essential (primary) hypertension: Secondary | ICD-10-CM | POA: Diagnosis not present

## 2021-11-08 DIAGNOSIS — C7951 Secondary malignant neoplasm of bone: Secondary | ICD-10-CM | POA: Diagnosis not present

## 2021-11-08 DIAGNOSIS — Z6821 Body mass index (BMI) 21.0-21.9, adult: Secondary | ICD-10-CM | POA: Diagnosis not present

## 2021-11-08 DIAGNOSIS — L309 Dermatitis, unspecified: Secondary | ICD-10-CM | POA: Diagnosis not present

## 2021-11-08 DIAGNOSIS — E038 Other specified hypothyroidism: Secondary | ICD-10-CM | POA: Diagnosis not present

## 2021-11-08 DIAGNOSIS — C3491 Malignant neoplasm of unspecified part of right bronchus or lung: Secondary | ICD-10-CM | POA: Diagnosis not present

## 2021-11-08 DIAGNOSIS — E063 Autoimmune thyroiditis: Secondary | ICD-10-CM | POA: Diagnosis not present

## 2021-11-08 DIAGNOSIS — C3411 Malignant neoplasm of upper lobe, right bronchus or lung: Secondary | ICD-10-CM | POA: Diagnosis not present

## 2021-11-08 DIAGNOSIS — I6381 Other cerebral infarction due to occlusion or stenosis of small artery: Secondary | ICD-10-CM | POA: Diagnosis not present

## 2021-11-08 DIAGNOSIS — Z1159 Encounter for screening for other viral diseases: Secondary | ICD-10-CM | POA: Diagnosis not present

## 2021-11-08 DIAGNOSIS — C3490 Malignant neoplasm of unspecified part of unspecified bronchus or lung: Secondary | ICD-10-CM | POA: Diagnosis not present

## 2021-11-08 DIAGNOSIS — D508 Other iron deficiency anemias: Secondary | ICD-10-CM | POA: Diagnosis not present

## 2021-11-14 ENCOUNTER — Encounter (INDEPENDENT_AMBULATORY_CARE_PROVIDER_SITE_OTHER): Payer: Self-pay | Admitting: Gastroenterology

## 2021-11-14 ENCOUNTER — Ambulatory Visit (INDEPENDENT_AMBULATORY_CARE_PROVIDER_SITE_OTHER): Payer: Medicare HMO | Admitting: Gastroenterology

## 2021-11-14 ENCOUNTER — Other Ambulatory Visit: Payer: Self-pay

## 2021-11-14 VITALS — BP 113/71 | HR 75 | Temp 98.9°F | Ht 64.0 in | Wt 126.1 lb

## 2021-11-14 DIAGNOSIS — D500 Iron deficiency anemia secondary to blood loss (chronic): Secondary | ICD-10-CM | POA: Diagnosis not present

## 2021-11-14 DIAGNOSIS — R1310 Dysphagia, unspecified: Secondary | ICD-10-CM

## 2021-11-14 DIAGNOSIS — K219 Gastro-esophageal reflux disease without esophagitis: Secondary | ICD-10-CM | POA: Diagnosis not present

## 2021-11-14 MED ORDER — OMEPRAZOLE 40 MG PO CPDR: 40.0000 mg | DELAYED_RELEASE_CAPSULE | Freq: Every day | ORAL | 3 refills | Status: AC

## 2021-11-14 NOTE — Progress Notes (Signed)
Katrinka Blazing, M.D. Gastroenterology & Hepatology [facility] [address] [place], [place] [address]  Primary Care Physician: Toma Deiters, MD [address] [place] [address]  I will communicate my assessment and recommendations to the referring MD via EMR.  Problems: Dysphagia Iron deficiency anemia H. Pylori gastritis  History of Present Illness: Carrie Adams is a 66 y.o. 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  The patient was last seen on 06/14/2019,. At that time, the patient had blood work-up performed for IDA that showed improvement in hemoglobin but with persistently low iron stores.  She was advised to continue taking oral ferrous sulfate 3 times a day as prior.  She was scheduled for both an EGD and a colonoscopy which were performed on 06/28/2021.  Esophagogastroduodenospy showed a 1 cm hiatal hernia with a few erosions in the gastric body (chronic as gastritis with H. pylori), normal duodenum (normal biopsies), there were 2 AVMs in the third portion of the duodenum which were ablated with APC probe.  Colonoscopy showed a 5 polyps in the cecum, ascending colon and descending colon between 2 to 8 mm in size which were resected.  Preparation was fair and patient was recommended to have repeat colonoscopy in 1 year.  Pathology on the polyps were consistent with tubular adenomas.  Patient was given a quadruple bismuth regimen for H. pylori gastritis. Last labs from 11/08/2021 showed Hb 13.2.  Patient reports that she felt her dysphagia was getting worse since November 2022. States she was feeling her solid food was getting stuck in the middle of her chest more frequently.  She has had a very hard time swallowing pills and feels it gets stuck in her chest. She has had to vomit the contents only once as the food contents did not go down. She reports that she was having the symptoms prior to starting her radiation therapy and at the time she had her EGD, but it was not as bad. She cannot tell me when symptoms started as she cannot recall this.  Denies any odynophagia but states she has heartburn with omeprazole 20 mg qday. States that it helped but she is still having heartburn symptoms daily. Uses Tums daily, which also helps.  Patient has been on carboplatinum plus Abraxane with pembrolizumab. She has not started radiation therapy yet.  The patient denies having any nausea, vomiting, fever, chills, hematochezia, melena, hematemesis, abdominal distention, abdominal pain, diarrhea, jaundice, pruritus. Has lost some weight due to fear of dysphagia episodes.  Last EGD: as above Last Colonoscopy: as above  Past Medical History: Past Medical History:  Diagnosis Date   Anxiety    Arthritis    Hypothyroidism    PAD (peripheral artery disease) (HCC)    PAD (peripheral artery disease) (HCC)    3 stents in groin and 1 in thigh   Stroke (HCC)    several, last 2013, balance problems, memory loss occ   Venous thrombosis     Past Surgical History: Past Surgical History:  Procedure Laterality Date   ABDOMINAL AORTIC ANEURYSM REPAIR  2021   ABDOMINAL HYSTERECTOMY     partial   APPENDECTOMY     with hysterectomy   BIOPSY  06/28/2021   Procedure: BIOPSY;  Surgeon: Marguerita Merles, Reuel Boom, MD;  [facility];  Service: Gastroenterology;;   CAROTID STENT  2018   COLONOSCOPY WITH PROPOFOL N/A 06/28/2021   Procedure: COLONOSCOPY WITH PROPOFOL;  Surgeon: Dolores Frame, MD;  [facility];  Service: Gastroenterology;  Laterality: N/A;  9:05   COSMETIC SURGERY Bilateral    breast implants    ESOPHAGOGASTRODUODENOSCOPY (EGD) WITH PROPOFOL  N/A 06/28/2021   Procedure: ESOPHAGOGASTRODUODENOSCOPY (EGD) WITH PROPOFOL;  Surgeon: Dolores Frame, MD;  [facility];  Service: Gastroenterology;  Laterality: N/A;   FEMUR IM NAIL  11/10/2011   Procedure: INTRAMEDULLARY (IM) NAIL FEMORAL;  Surgeon: Senaida Lange, MD;  [facility];  Service: Orthopedics;  Laterality: Left;   HARDWARE REMOVAL Left 04/06/2016   Procedure: HARDWARE REMOVAL LEFT HIP;  Surgeon: Samson Frederic, MD;  [facility];  Service: Orthopedics;  Laterality: Left;   HOT HEMOSTASIS  06/28/2021   Procedure: HOT HEMOSTASIS (ARGON PLASMA COAGULATION/BICAP);  Surgeon: Marguerita Merles, Reuel Boom, MD;  [facility];  Service: Gastroenterology;;  small bowel AVMs   POLYPECTOMY  06/28/2021   Procedure: POLYPECTOMY;  Surgeon: Dolores Frame, MD;  [facility];  Service: Gastroenterology;;    Family History: Family History  Problem Relation Age of Onset   Lung cancer Father     Social History: Social History   Tobacco Use  Smoking Status Every Day   Packs/day: 0.50   Years: 33.00   Pack years: 16.50   Types: Cigarettes  Smokeless Tobacco Never   Social History   Substance and Sexual Activity  Alcohol Use No   Social History   Substance and Sexual Activity  Drug Use No    Allergies: Allergies  Allergen Reactions   Amoxicillin Nausea And Vomiting    Has patient had a PCN reaction causing immediate rash, facial/tongue/throat swelling, SOB or lightheadedness with hypotension: No Has patient had a PCN reaction causing severe rash involving mucus membranes or skin necrosis: No Has patient had a PCN reaction that required hospitalization No Has patient had a PCN reaction occurring within the last 10 years: No If all of the above answers are "NO", then may proceed with Cephalosporin use.     Medications: Current Outpatient Medications  Medication Sig Dispense Refill   albuterol (VENTOLIN HFA) 108 (90  Base) MCG/ACT inhaler Inhale 1 puff into the lungs every 4 (four) hours as needed.     ALLERGY RELIEF 180 MG tablet Take 1 tablet by mouth daily.     amLODipine (NORVASC) 5 MG tablet Take 1 tablet by mouth daily.     aspirin EC 81 MG tablet Take 81 mg by mouth daily.     calcium carbonate (OS-CAL - DOSED IN MG OF ELEMENTAL CALCIUM) 1250 (500 Ca) MG tablet Take 1 tablet by mouth daily with breakfast.     chlorthalidone (HYGROTON) 25 MG tablet Take 25 mg by mouth daily.     clopidogrel (PLAVIX) 75 MG tablet Take 75 mg by mouth daily.     Cyanocobalamin (VITAMIN B-12 PO) Take 1 tablet by mouth daily.     DULoxetine (CYMBALTA) 60 MG capsule Take 60 mg by mouth 2 (two) times daily.  0   ferrous sulfate 325 (65 FE) MG tablet Take 325 mg by mouth 3 (three) times daily with meals.     levothyroxine (SYNTHROID) 112 MCG tablet Take 1 tablet (112 mcg total) by mouth daily before breakfast. 90 tablet 1   metoprolol succinate (TOPROL-XL) 50  MG 24 hr tablet Take 50 mg by mouth 2 (two) times daily.  0   mupirocin ointment (BACTROBAN) 2 % as needed.     nystatin cream (MYCOSTATIN) Apply topically as needed.     Omega-3 Fatty Acids (FISH OIL) 1000 MG CAPS Take 1 capsule by mouth daily.     omeprazole (PRILOSEC) 20 MG capsule Take 20 mg by mouth daily.     OVER THE COUNTER MEDICATION Vit D one po Qd     pravastatin (PRAVACHOL) 20 MG tablet Take 20 mg by mouth at bedtime.  0   triamcinolone cream (KENALOG) 0.1 % Apply 1 application topically daily.  0   vitamin C (ASCORBIC ACID) 500 MG tablet Take 500 mg by mouth daily.     No current facility-administered medications for this visit.    Review of Systems: GENERAL: negative for malaise, night sweats HEENT: No changes in hearing or vision, no nose bleeds or other nasal problems. NECK: Negative for lumps, goiter, pain and significant neck swelling RESPIRATORY: Negative for cough, wheezing CARDIOVASCULAR: Negative for chest pain, leg swelling, palpitations,  orthopnea GI: SEE HPI MUSCULOSKELETAL: Negative for joint pain or swelling, back pain, and muscle pain. SKIN: Negative for lesions, rash PSYCH: Negative for sleep disturbance, mood disorder and recent psychosocial stressors. HEMATOLOGY Negative for prolonged bleeding, bruising easily, and swollen nodes. ENDOCRINE: Negative for cold or heat intolerance, polyuria, polydipsia and goiter. NEURO: negative for tremor, gait imbalance, syncope and seizures. The remainder of the review of systems is noncontributory.   Physical Exam: BP 113/71 (BP Location: Left Arm, Patient Position: Sitting, Cuff Size: Large)   Pulse 75   Temp 98.9 F (37.2 C) (Oral)   Ht 5\' 4"  (1.626 m)   Wt 126 lb 1.6 oz (57.2 kg)   BMI 21.65 kg/m  GENERAL: The patient is AO x3, in no acute distress. Frail. Uses cane. HEENT: Head is normocephalic and atraumatic. EOMI are intact. Mouth is well hydrated and without lesions. NECK: Supple. No masses LUNGS: Clear to auscultation. No presence of rhonchi/wheezing/rales. Adequate chest expansion HEART: RRR, normal s1 and s2. ABDOMEN: Soft, nontender, no guarding, no peritoneal signs, and nondistended. BS +. No masses. EXTREMITIES: Without any cyanosis, clubbing, rash, lesions or edema. NEUROLOGIC: AOx3, no focal motor deficit. SKIN: no jaundice, no rashes  Imaging/Labs: as above  I personally reviewed and interpreted the available labs, imaging and endoscopic files.  Impression and Plan: Carrie Adams is a 66 y.o. 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 Patient has presented worsening recurrent episodes of dysphagia with negative endoscopic evaluation recently. Has presented some significant weight loss. Notably she is also  presenting recurrent GERD related symptoms, for which she may benefit from increasing omeprazole to 40 mg qday. It is possible that her dysphagia symptoms are related to primary multiple oropharynx or esophagus given her history of strokes, for which we will investigate this further with MBS and barium esophagram.  Patient understood and agreed.    As she has responded to oral oral intake, we will continue with her oral iron 3 times a day.  - Schedule modified barium swallow and barium esophagram with pill - Increase omeprazole to 40 mg qday - Continue oral iron  All questions were answered.  Dolores Frame, MD Gastroenterology and Hepatology [facility]

## 2021-11-14 NOTE — Patient Instructions (Addendum)
Schedule modified barium swallow and barium esophagram with pill ?Increase omeprazole to 40 mg qday ?Continue oral iron ?

## 2021-11-15 DIAGNOSIS — C3411 Malignant neoplasm of upper lobe, right bronchus or lung: Secondary | ICD-10-CM | POA: Diagnosis not present

## 2021-11-15 DIAGNOSIS — Z5112 Encounter for antineoplastic immunotherapy: Secondary | ICD-10-CM | POA: Diagnosis not present

## 2021-11-15 DIAGNOSIS — Z79899 Other long term (current) drug therapy: Secondary | ICD-10-CM | POA: Diagnosis not present

## 2021-11-15 DIAGNOSIS — Z1159 Encounter for screening for other viral diseases: Secondary | ICD-10-CM | POA: Diagnosis not present

## 2021-11-15 DIAGNOSIS — Z5111 Encounter for antineoplastic chemotherapy: Secondary | ICD-10-CM | POA: Diagnosis not present

## 2021-11-17 ENCOUNTER — Other Ambulatory Visit (HOSPITAL_COMMUNITY): Payer: Self-pay | Admitting: Specialist

## 2021-11-17 DIAGNOSIS — C3491 Malignant neoplasm of unspecified part of right bronchus or lung: Secondary | ICD-10-CM

## 2021-11-17 DIAGNOSIS — R1312 Dysphagia, oropharyngeal phase: Secondary | ICD-10-CM

## 2021-11-17 DIAGNOSIS — K219 Gastro-esophageal reflux disease without esophagitis: Secondary | ICD-10-CM

## 2021-11-21 DIAGNOSIS — E063 Autoimmune thyroiditis: Secondary | ICD-10-CM | POA: Diagnosis not present

## 2021-11-21 DIAGNOSIS — D508 Other iron deficiency anemias: Secondary | ICD-10-CM | POA: Diagnosis not present

## 2021-11-21 DIAGNOSIS — C3411 Malignant neoplasm of upper lobe, right bronchus or lung: Secondary | ICD-10-CM | POA: Diagnosis not present

## 2021-11-22 ENCOUNTER — Other Ambulatory Visit (HOSPITAL_COMMUNITY): Payer: Medicare HMO

## 2021-11-23 ENCOUNTER — Ambulatory Visit (HOSPITAL_COMMUNITY): Admission: RE | Admit: 2021-11-23 | Payer: Medicare HMO | Source: Ambulatory Visit

## 2021-11-28 DIAGNOSIS — C3411 Malignant neoplasm of upper lobe, right bronchus or lung: Secondary | ICD-10-CM | POA: Diagnosis not present

## 2021-11-28 DIAGNOSIS — F17210 Nicotine dependence, cigarettes, uncomplicated: Secondary | ICD-10-CM | POA: Diagnosis not present

## 2021-11-28 DIAGNOSIS — R69 Illness, unspecified: Secondary | ICD-10-CM | POA: Diagnosis not present

## 2021-11-29 DIAGNOSIS — C3411 Malignant neoplasm of upper lobe, right bronchus or lung: Secondary | ICD-10-CM | POA: Diagnosis not present

## 2021-11-30 ENCOUNTER — Other Ambulatory Visit: Payer: Medicare HMO

## 2021-11-30 ENCOUNTER — Other Ambulatory Visit (HOSPITAL_COMMUNITY): Payer: Medicare HMO

## 2021-12-01 ENCOUNTER — Ambulatory Visit (INDEPENDENT_AMBULATORY_CARE_PROVIDER_SITE_OTHER): Payer: Medicare HMO | Admitting: Gastroenterology

## 2021-12-06 DIAGNOSIS — Z1159 Encounter for screening for other viral diseases: Secondary | ICD-10-CM | POA: Diagnosis not present

## 2021-12-06 DIAGNOSIS — Z79899 Other long term (current) drug therapy: Secondary | ICD-10-CM | POA: Diagnosis not present

## 2021-12-06 DIAGNOSIS — G893 Neoplasm related pain (acute) (chronic): Secondary | ICD-10-CM | POA: Diagnosis not present

## 2021-12-06 DIAGNOSIS — C7951 Secondary malignant neoplasm of bone: Secondary | ICD-10-CM | POA: Diagnosis not present

## 2021-12-06 DIAGNOSIS — C3411 Malignant neoplasm of upper lobe, right bronchus or lung: Secondary | ICD-10-CM | POA: Diagnosis not present

## 2021-12-07 DIAGNOSIS — Z5112 Encounter for antineoplastic immunotherapy: Secondary | ICD-10-CM | POA: Diagnosis not present

## 2021-12-07 DIAGNOSIS — Z79899 Other long term (current) drug therapy: Secondary | ICD-10-CM | POA: Diagnosis not present

## 2021-12-07 DIAGNOSIS — Z5111 Encounter for antineoplastic chemotherapy: Secondary | ICD-10-CM | POA: Diagnosis not present

## 2021-12-07 DIAGNOSIS — C3411 Malignant neoplasm of upper lobe, right bronchus or lung: Secondary | ICD-10-CM | POA: Diagnosis not present

## 2021-12-07 DIAGNOSIS — Z1159 Encounter for screening for other viral diseases: Secondary | ICD-10-CM | POA: Diagnosis not present

## 2021-12-08 ENCOUNTER — Other Ambulatory Visit (HOSPITAL_COMMUNITY): Payer: Medicare HMO

## 2021-12-08 ENCOUNTER — Inpatient Hospital Stay (HOSPITAL_COMMUNITY): Admission: RE | Admit: 2021-12-08 | Payer: Medicare HMO | Source: Ambulatory Visit

## 2021-12-08 ENCOUNTER — Ambulatory Visit (HOSPITAL_COMMUNITY): Payer: Medicare HMO | Attending: Gastroenterology | Admitting: Speech Pathology

## 2021-12-13 DIAGNOSIS — T451X5A Adverse effect of antineoplastic and immunosuppressive drugs, initial encounter: Secondary | ICD-10-CM | POA: Diagnosis not present

## 2021-12-13 DIAGNOSIS — D701 Agranulocytosis secondary to cancer chemotherapy: Secondary | ICD-10-CM | POA: Diagnosis not present

## 2021-12-13 DIAGNOSIS — C7951 Secondary malignant neoplasm of bone: Secondary | ICD-10-CM | POA: Diagnosis not present

## 2021-12-13 DIAGNOSIS — Z79899 Other long term (current) drug therapy: Secondary | ICD-10-CM | POA: Diagnosis not present

## 2021-12-15 DIAGNOSIS — Z801 Family history of malignant neoplasm of trachea, bronchus and lung: Secondary | ICD-10-CM | POA: Diagnosis not present

## 2021-12-15 DIAGNOSIS — I2541 Coronary artery aneurysm: Secondary | ICD-10-CM | POA: Diagnosis not present

## 2021-12-15 DIAGNOSIS — F17200 Nicotine dependence, unspecified, uncomplicated: Secondary | ICD-10-CM | POA: Diagnosis not present

## 2021-12-15 DIAGNOSIS — E7800 Pure hypercholesterolemia, unspecified: Secondary | ICD-10-CM | POA: Diagnosis not present

## 2021-12-15 DIAGNOSIS — M546 Pain in thoracic spine: Secondary | ICD-10-CM | POA: Diagnosis not present

## 2021-12-15 DIAGNOSIS — C7952 Secondary malignant neoplasm of bone marrow: Secondary | ICD-10-CM | POA: Diagnosis not present

## 2021-12-15 DIAGNOSIS — C7951 Secondary malignant neoplasm of bone: Secondary | ICD-10-CM | POA: Diagnosis not present

## 2021-12-15 DIAGNOSIS — I10 Essential (primary) hypertension: Secondary | ICD-10-CM | POA: Diagnosis not present

## 2021-12-15 DIAGNOSIS — C3411 Malignant neoplasm of upper lobe, right bronchus or lung: Secondary | ICD-10-CM | POA: Diagnosis not present

## 2021-12-15 DIAGNOSIS — E278 Other specified disorders of adrenal gland: Secondary | ICD-10-CM | POA: Diagnosis not present

## 2021-12-15 DIAGNOSIS — Z5111 Encounter for antineoplastic chemotherapy: Secondary | ICD-10-CM | POA: Diagnosis not present

## 2021-12-15 DIAGNOSIS — J449 Chronic obstructive pulmonary disease, unspecified: Secondary | ICD-10-CM | POA: Diagnosis not present

## 2021-12-15 DIAGNOSIS — S22020A Wedge compression fracture of second thoracic vertebra, initial encounter for closed fracture: Secondary | ICD-10-CM | POA: Diagnosis not present

## 2021-12-15 DIAGNOSIS — Z510 Encounter for antineoplastic radiation therapy: Secondary | ICD-10-CM | POA: Diagnosis not present

## 2021-12-15 DIAGNOSIS — Z1159 Encounter for screening for other viral diseases: Secondary | ICD-10-CM | POA: Diagnosis not present

## 2021-12-15 DIAGNOSIS — Z808 Family history of malignant neoplasm of other organs or systems: Secondary | ICD-10-CM | POA: Diagnosis not present

## 2021-12-15 DIAGNOSIS — I739 Peripheral vascular disease, unspecified: Secondary | ICD-10-CM | POA: Diagnosis not present

## 2021-12-19 ENCOUNTER — Other Ambulatory Visit (HOSPITAL_COMMUNITY): Payer: Self-pay | Admitting: Specialist

## 2021-12-19 DIAGNOSIS — R1319 Other dysphagia: Secondary | ICD-10-CM

## 2021-12-21 DIAGNOSIS — Z79899 Other long term (current) drug therapy: Secondary | ICD-10-CM | POA: Diagnosis not present

## 2021-12-21 DIAGNOSIS — T451X5A Adverse effect of antineoplastic and immunosuppressive drugs, initial encounter: Secondary | ICD-10-CM | POA: Diagnosis not present

## 2021-12-21 DIAGNOSIS — D701 Agranulocytosis secondary to cancer chemotherapy: Secondary | ICD-10-CM | POA: Diagnosis not present

## 2021-12-21 DIAGNOSIS — Z1159 Encounter for screening for other viral diseases: Secondary | ICD-10-CM | POA: Diagnosis not present

## 2021-12-21 DIAGNOSIS — C7951 Secondary malignant neoplasm of bone: Secondary | ICD-10-CM | POA: Diagnosis not present

## 2021-12-21 DIAGNOSIS — C3411 Malignant neoplasm of upper lobe, right bronchus or lung: Secondary | ICD-10-CM | POA: Diagnosis not present

## 2021-12-22 DIAGNOSIS — Z1159 Encounter for screening for other viral diseases: Secondary | ICD-10-CM | POA: Diagnosis not present

## 2021-12-22 DIAGNOSIS — C3411 Malignant neoplasm of upper lobe, right bronchus or lung: Secondary | ICD-10-CM | POA: Diagnosis not present

## 2021-12-22 DIAGNOSIS — Z5111 Encounter for antineoplastic chemotherapy: Secondary | ICD-10-CM | POA: Diagnosis not present

## 2021-12-22 DIAGNOSIS — Z79899 Other long term (current) drug therapy: Secondary | ICD-10-CM | POA: Diagnosis not present

## 2021-12-27 DIAGNOSIS — K5904 Chronic idiopathic constipation: Secondary | ICD-10-CM | POA: Diagnosis not present

## 2021-12-27 DIAGNOSIS — C3411 Malignant neoplasm of upper lobe, right bronchus or lung: Secondary | ICD-10-CM | POA: Diagnosis not present

## 2021-12-27 DIAGNOSIS — Z1159 Encounter for screening for other viral diseases: Secondary | ICD-10-CM | POA: Diagnosis not present

## 2021-12-27 DIAGNOSIS — E039 Hypothyroidism, unspecified: Secondary | ICD-10-CM | POA: Diagnosis not present

## 2021-12-27 DIAGNOSIS — Z79899 Other long term (current) drug therapy: Secondary | ICD-10-CM | POA: Diagnosis not present

## 2021-12-27 DIAGNOSIS — E063 Autoimmune thyroiditis: Secondary | ICD-10-CM | POA: Diagnosis not present

## 2021-12-29 DIAGNOSIS — Z79899 Other long term (current) drug therapy: Secondary | ICD-10-CM | POA: Diagnosis not present

## 2021-12-29 DIAGNOSIS — Z1159 Encounter for screening for other viral diseases: Secondary | ICD-10-CM | POA: Diagnosis not present

## 2021-12-29 DIAGNOSIS — C3411 Malignant neoplasm of upper lobe, right bronchus or lung: Secondary | ICD-10-CM | POA: Diagnosis not present

## 2022-01-03 DIAGNOSIS — C3411 Malignant neoplasm of upper lobe, right bronchus or lung: Secondary | ICD-10-CM | POA: Diagnosis not present

## 2022-01-03 DIAGNOSIS — Z1159 Encounter for screening for other viral diseases: Secondary | ICD-10-CM | POA: Diagnosis not present

## 2022-01-03 DIAGNOSIS — Z79899 Other long term (current) drug therapy: Secondary | ICD-10-CM | POA: Diagnosis not present

## 2022-01-05 DIAGNOSIS — R079 Chest pain, unspecified: Secondary | ICD-10-CM | POA: Diagnosis not present

## 2022-01-05 DIAGNOSIS — R059 Cough, unspecified: Secondary | ICD-10-CM | POA: Diagnosis not present

## 2022-01-05 DIAGNOSIS — Z9882 Breast implant status: Secondary | ICD-10-CM | POA: Diagnosis not present

## 2022-01-05 DIAGNOSIS — R911 Solitary pulmonary nodule: Secondary | ICD-10-CM | POA: Diagnosis not present

## 2022-01-05 DIAGNOSIS — C3411 Malignant neoplasm of upper lobe, right bronchus or lung: Secondary | ICD-10-CM | POA: Diagnosis not present

## 2022-01-05 DIAGNOSIS — Z5111 Encounter for antineoplastic chemotherapy: Secondary | ICD-10-CM | POA: Diagnosis not present

## 2022-01-10 DIAGNOSIS — Z79899 Other long term (current) drug therapy: Secondary | ICD-10-CM | POA: Diagnosis not present

## 2022-01-10 DIAGNOSIS — D701 Agranulocytosis secondary to cancer chemotherapy: Secondary | ICD-10-CM | POA: Diagnosis not present

## 2022-01-10 DIAGNOSIS — T451X5A Adverse effect of antineoplastic and immunosuppressive drugs, initial encounter: Secondary | ICD-10-CM | POA: Diagnosis not present

## 2022-01-10 DIAGNOSIS — C7951 Secondary malignant neoplasm of bone: Secondary | ICD-10-CM | POA: Diagnosis not present

## 2022-01-10 DIAGNOSIS — C3411 Malignant neoplasm of upper lobe, right bronchus or lung: Secondary | ICD-10-CM | POA: Diagnosis not present

## 2022-01-10 DIAGNOSIS — Z1159 Encounter for screening for other viral diseases: Secondary | ICD-10-CM | POA: Diagnosis not present

## 2022-01-15 DIAGNOSIS — R053 Chronic cough: Secondary | ICD-10-CM | POA: Diagnosis not present

## 2022-01-17 ENCOUNTER — Ambulatory Visit (HOSPITAL_COMMUNITY): Payer: Medicare HMO | Attending: Gastroenterology | Admitting: Speech Pathology

## 2022-01-17 ENCOUNTER — Other Ambulatory Visit (HOSPITAL_COMMUNITY): Payer: Medicare HMO

## 2022-01-17 DIAGNOSIS — C7951 Secondary malignant neoplasm of bone: Secondary | ICD-10-CM | POA: Diagnosis not present

## 2022-01-17 DIAGNOSIS — E063 Autoimmune thyroiditis: Secondary | ICD-10-CM | POA: Diagnosis not present

## 2022-01-17 DIAGNOSIS — C3411 Malignant neoplasm of upper lobe, right bronchus or lung: Secondary | ICD-10-CM | POA: Diagnosis not present

## 2022-01-17 DIAGNOSIS — G893 Neoplasm related pain (acute) (chronic): Secondary | ICD-10-CM | POA: Diagnosis not present

## 2022-01-17 DIAGNOSIS — Z95828 Presence of other vascular implants and grafts: Secondary | ICD-10-CM | POA: Diagnosis not present

## 2022-01-17 DIAGNOSIS — Z79899 Other long term (current) drug therapy: Secondary | ICD-10-CM | POA: Diagnosis not present

## 2022-01-17 DIAGNOSIS — Z1159 Encounter for screening for other viral diseases: Secondary | ICD-10-CM | POA: Diagnosis not present

## 2022-01-18 DIAGNOSIS — Z79899 Other long term (current) drug therapy: Secondary | ICD-10-CM | POA: Diagnosis not present

## 2022-01-18 DIAGNOSIS — Z95828 Presence of other vascular implants and grafts: Secondary | ICD-10-CM | POA: Diagnosis not present

## 2022-01-18 DIAGNOSIS — C3411 Malignant neoplasm of upper lobe, right bronchus or lung: Secondary | ICD-10-CM | POA: Diagnosis not present

## 2022-01-18 DIAGNOSIS — Z1159 Encounter for screening for other viral diseases: Secondary | ICD-10-CM | POA: Diagnosis not present

## 2022-01-18 DIAGNOSIS — Z5111 Encounter for antineoplastic chemotherapy: Secondary | ICD-10-CM | POA: Diagnosis not present

## 2022-01-24 DIAGNOSIS — C7951 Secondary malignant neoplasm of bone: Secondary | ICD-10-CM | POA: Diagnosis not present

## 2022-01-24 DIAGNOSIS — Z1159 Encounter for screening for other viral diseases: Secondary | ICD-10-CM | POA: Diagnosis not present

## 2022-01-24 DIAGNOSIS — Z79899 Other long term (current) drug therapy: Secondary | ICD-10-CM | POA: Diagnosis not present

## 2022-01-24 DIAGNOSIS — T451X5A Adverse effect of antineoplastic and immunosuppressive drugs, initial encounter: Secondary | ICD-10-CM | POA: Diagnosis not present

## 2022-01-24 DIAGNOSIS — C3411 Malignant neoplasm of upper lobe, right bronchus or lung: Secondary | ICD-10-CM | POA: Diagnosis not present

## 2022-01-24 DIAGNOSIS — D701 Agranulocytosis secondary to cancer chemotherapy: Secondary | ICD-10-CM | POA: Diagnosis not present

## 2022-01-25 DIAGNOSIS — C7951 Secondary malignant neoplasm of bone: Secondary | ICD-10-CM | POA: Diagnosis not present

## 2022-01-25 DIAGNOSIS — T402X5A Adverse effect of other opioids, initial encounter: Secondary | ICD-10-CM | POA: Diagnosis not present

## 2022-01-25 DIAGNOSIS — Z1159 Encounter for screening for other viral diseases: Secondary | ICD-10-CM | POA: Diagnosis not present

## 2022-01-25 DIAGNOSIS — Z79890 Hormone replacement therapy (postmenopausal): Secondary | ICD-10-CM | POA: Diagnosis not present

## 2022-01-25 DIAGNOSIS — L299 Pruritus, unspecified: Secondary | ICD-10-CM | POA: Diagnosis not present

## 2022-01-25 DIAGNOSIS — Z7902 Long term (current) use of antithrombotics/antiplatelets: Secondary | ICD-10-CM | POA: Diagnosis not present

## 2022-01-25 DIAGNOSIS — Z79899 Other long term (current) drug therapy: Secondary | ICD-10-CM | POA: Diagnosis not present

## 2022-01-25 DIAGNOSIS — C3411 Malignant neoplasm of upper lobe, right bronchus or lung: Secondary | ICD-10-CM | POA: Diagnosis not present

## 2022-01-25 DIAGNOSIS — G893 Neoplasm related pain (acute) (chronic): Secondary | ICD-10-CM | POA: Diagnosis not present

## 2022-01-25 DIAGNOSIS — Z95828 Presence of other vascular implants and grafts: Secondary | ICD-10-CM | POA: Diagnosis not present

## 2022-01-25 DIAGNOSIS — Z5111 Encounter for antineoplastic chemotherapy: Secondary | ICD-10-CM | POA: Diagnosis not present

## 2022-01-25 DIAGNOSIS — K5903 Drug induced constipation: Secondary | ICD-10-CM | POA: Diagnosis not present

## 2022-01-31 DIAGNOSIS — D701 Agranulocytosis secondary to cancer chemotherapy: Secondary | ICD-10-CM | POA: Diagnosis not present

## 2022-01-31 DIAGNOSIS — T451X5A Adverse effect of antineoplastic and immunosuppressive drugs, initial encounter: Secondary | ICD-10-CM | POA: Diagnosis not present

## 2022-01-31 DIAGNOSIS — C3411 Malignant neoplasm of upper lobe, right bronchus or lung: Secondary | ICD-10-CM | POA: Diagnosis not present

## 2022-01-31 DIAGNOSIS — E038 Other specified hypothyroidism: Secondary | ICD-10-CM | POA: Diagnosis not present

## 2022-01-31 DIAGNOSIS — Z1159 Encounter for screening for other viral diseases: Secondary | ICD-10-CM | POA: Diagnosis not present

## 2022-01-31 DIAGNOSIS — Z79899 Other long term (current) drug therapy: Secondary | ICD-10-CM | POA: Diagnosis not present

## 2022-02-01 DIAGNOSIS — C3411 Malignant neoplasm of upper lobe, right bronchus or lung: Secondary | ICD-10-CM | POA: Diagnosis not present

## 2022-02-01 DIAGNOSIS — Z5111 Encounter for antineoplastic chemotherapy: Secondary | ICD-10-CM | POA: Diagnosis not present

## 2022-02-01 DIAGNOSIS — Z1159 Encounter for screening for other viral diseases: Secondary | ICD-10-CM | POA: Diagnosis not present

## 2022-02-01 DIAGNOSIS — Z79899 Other long term (current) drug therapy: Secondary | ICD-10-CM | POA: Diagnosis not present

## 2022-02-07 DIAGNOSIS — D701 Agranulocytosis secondary to cancer chemotherapy: Secondary | ICD-10-CM | POA: Diagnosis not present

## 2022-02-07 DIAGNOSIS — T451X5A Adverse effect of antineoplastic and immunosuppressive drugs, initial encounter: Secondary | ICD-10-CM | POA: Diagnosis not present

## 2022-02-07 DIAGNOSIS — C3411 Malignant neoplasm of upper lobe, right bronchus or lung: Secondary | ICD-10-CM | POA: Diagnosis not present

## 2022-02-08 DIAGNOSIS — Z79899 Other long term (current) drug therapy: Secondary | ICD-10-CM | POA: Diagnosis not present

## 2022-02-08 DIAGNOSIS — C3411 Malignant neoplasm of upper lobe, right bronchus or lung: Secondary | ICD-10-CM | POA: Diagnosis not present

## 2022-02-08 DIAGNOSIS — Z95828 Presence of other vascular implants and grafts: Secondary | ICD-10-CM | POA: Diagnosis not present

## 2022-02-08 DIAGNOSIS — Z5111 Encounter for antineoplastic chemotherapy: Secondary | ICD-10-CM | POA: Diagnosis not present

## 2022-02-08 DIAGNOSIS — Z1159 Encounter for screening for other viral diseases: Secondary | ICD-10-CM | POA: Diagnosis not present

## 2022-02-09 DIAGNOSIS — T451X5A Adverse effect of antineoplastic and immunosuppressive drugs, initial encounter: Secondary | ICD-10-CM | POA: Diagnosis not present

## 2022-02-09 DIAGNOSIS — E039 Hypothyroidism, unspecified: Secondary | ICD-10-CM | POA: Diagnosis not present

## 2022-02-09 DIAGNOSIS — E7849 Other hyperlipidemia: Secondary | ICD-10-CM | POA: Diagnosis not present

## 2022-02-09 DIAGNOSIS — Z681 Body mass index (BMI) 19 or less, adult: Secondary | ICD-10-CM | POA: Diagnosis not present

## 2022-02-09 DIAGNOSIS — I10 Essential (primary) hypertension: Secondary | ICD-10-CM | POA: Diagnosis not present

## 2022-02-09 DIAGNOSIS — D701 Agranulocytosis secondary to cancer chemotherapy: Secondary | ICD-10-CM | POA: Diagnosis not present

## 2022-02-09 DIAGNOSIS — K219 Gastro-esophageal reflux disease without esophagitis: Secondary | ICD-10-CM | POA: Diagnosis not present

## 2022-02-09 DIAGNOSIS — C3481 Malignant neoplasm of overlapping sites of right bronchus and lung: Secondary | ICD-10-CM | POA: Diagnosis not present

## 2022-02-09 DIAGNOSIS — J449 Chronic obstructive pulmonary disease, unspecified: Secondary | ICD-10-CM | POA: Diagnosis not present

## 2022-02-10 DIAGNOSIS — D701 Agranulocytosis secondary to cancer chemotherapy: Secondary | ICD-10-CM | POA: Diagnosis not present

## 2022-02-10 DIAGNOSIS — T451X5A Adverse effect of antineoplastic and immunosuppressive drugs, initial encounter: Secondary | ICD-10-CM | POA: Diagnosis not present

## 2022-02-13 DIAGNOSIS — G893 Neoplasm related pain (acute) (chronic): Secondary | ICD-10-CM | POA: Diagnosis not present

## 2022-02-13 DIAGNOSIS — Z95828 Presence of other vascular implants and grafts: Secondary | ICD-10-CM | POA: Diagnosis not present

## 2022-02-13 DIAGNOSIS — C7951 Secondary malignant neoplasm of bone: Secondary | ICD-10-CM | POA: Diagnosis not present

## 2022-02-13 DIAGNOSIS — E063 Autoimmune thyroiditis: Secondary | ICD-10-CM | POA: Diagnosis not present

## 2022-02-13 DIAGNOSIS — T451X5A Adverse effect of antineoplastic and immunosuppressive drugs, initial encounter: Secondary | ICD-10-CM | POA: Diagnosis not present

## 2022-02-13 DIAGNOSIS — E876 Hypokalemia: Secondary | ICD-10-CM | POA: Diagnosis not present

## 2022-02-13 DIAGNOSIS — Z7689 Persons encountering health services in other specified circumstances: Secondary | ICD-10-CM | POA: Diagnosis not present

## 2022-02-13 DIAGNOSIS — D701 Agranulocytosis secondary to cancer chemotherapy: Secondary | ICD-10-CM | POA: Diagnosis not present

## 2022-02-13 DIAGNOSIS — C3411 Malignant neoplasm of upper lobe, right bronchus or lung: Secondary | ICD-10-CM | POA: Diagnosis not present

## 2022-02-15 DIAGNOSIS — C3411 Malignant neoplasm of upper lobe, right bronchus or lung: Secondary | ICD-10-CM | POA: Diagnosis not present

## 2022-02-15 DIAGNOSIS — Z5111 Encounter for antineoplastic chemotherapy: Secondary | ICD-10-CM | POA: Diagnosis not present

## 2022-02-15 DIAGNOSIS — G893 Neoplasm related pain (acute) (chronic): Secondary | ICD-10-CM | POA: Diagnosis not present

## 2022-02-15 DIAGNOSIS — E876 Hypokalemia: Secondary | ICD-10-CM | POA: Diagnosis not present

## 2022-02-15 DIAGNOSIS — C7951 Secondary malignant neoplasm of bone: Secondary | ICD-10-CM | POA: Diagnosis not present

## 2022-02-16 ENCOUNTER — Ambulatory Visit (INDEPENDENT_AMBULATORY_CARE_PROVIDER_SITE_OTHER): Payer: Medicare HMO | Admitting: Gastroenterology

## 2022-02-16 DIAGNOSIS — D649 Anemia, unspecified: Secondary | ICD-10-CM | POA: Diagnosis not present

## 2022-02-16 DIAGNOSIS — Z95828 Presence of other vascular implants and grafts: Secondary | ICD-10-CM | POA: Diagnosis not present

## 2022-02-21 DIAGNOSIS — D701 Agranulocytosis secondary to cancer chemotherapy: Secondary | ICD-10-CM | POA: Diagnosis not present

## 2022-02-21 DIAGNOSIS — D508 Other iron deficiency anemias: Secondary | ICD-10-CM | POA: Diagnosis not present

## 2022-02-21 DIAGNOSIS — E038 Other specified hypothyroidism: Secondary | ICD-10-CM | POA: Diagnosis not present

## 2022-02-21 DIAGNOSIS — T451X5A Adverse effect of antineoplastic and immunosuppressive drugs, initial encounter: Secondary | ICD-10-CM | POA: Diagnosis not present

## 2022-02-21 DIAGNOSIS — C7951 Secondary malignant neoplasm of bone: Secondary | ICD-10-CM | POA: Diagnosis not present

## 2022-02-21 DIAGNOSIS — C3411 Malignant neoplasm of upper lobe, right bronchus or lung: Secondary | ICD-10-CM | POA: Diagnosis not present

## 2022-02-21 DIAGNOSIS — Z79899 Other long term (current) drug therapy: Secondary | ICD-10-CM | POA: Diagnosis not present

## 2022-02-21 DIAGNOSIS — Z1159 Encounter for screening for other viral diseases: Secondary | ICD-10-CM | POA: Diagnosis not present

## 2022-02-22 DIAGNOSIS — Z5111 Encounter for antineoplastic chemotherapy: Secondary | ICD-10-CM | POA: Diagnosis not present

## 2022-02-22 DIAGNOSIS — Z79899 Other long term (current) drug therapy: Secondary | ICD-10-CM | POA: Diagnosis not present

## 2022-02-22 DIAGNOSIS — C3411 Malignant neoplasm of upper lobe, right bronchus or lung: Secondary | ICD-10-CM | POA: Diagnosis not present

## 2022-02-22 DIAGNOSIS — Z1159 Encounter for screening for other viral diseases: Secondary | ICD-10-CM | POA: Diagnosis not present

## 2022-02-27 DIAGNOSIS — E038 Other specified hypothyroidism: Secondary | ICD-10-CM | POA: Diagnosis not present

## 2022-02-27 DIAGNOSIS — D701 Agranulocytosis secondary to cancer chemotherapy: Secondary | ICD-10-CM | POA: Diagnosis not present

## 2022-02-27 DIAGNOSIS — T451X5A Adverse effect of antineoplastic and immunosuppressive drugs, initial encounter: Secondary | ICD-10-CM | POA: Diagnosis not present

## 2022-02-27 DIAGNOSIS — D508 Other iron deficiency anemias: Secondary | ICD-10-CM | POA: Diagnosis not present

## 2022-02-27 DIAGNOSIS — C3411 Malignant neoplasm of upper lobe, right bronchus or lung: Secondary | ICD-10-CM | POA: Diagnosis not present

## 2022-03-01 ENCOUNTER — Ambulatory Visit: Payer: Medicare HMO | Admitting: "Endocrinology

## 2022-03-07 DIAGNOSIS — C7951 Secondary malignant neoplasm of bone: Secondary | ICD-10-CM | POA: Diagnosis not present

## 2022-03-07 DIAGNOSIS — D701 Agranulocytosis secondary to cancer chemotherapy: Secondary | ICD-10-CM | POA: Diagnosis not present

## 2022-03-07 DIAGNOSIS — Z95828 Presence of other vascular implants and grafts: Secondary | ICD-10-CM | POA: Diagnosis not present

## 2022-03-07 DIAGNOSIS — Z79899 Other long term (current) drug therapy: Secondary | ICD-10-CM | POA: Diagnosis not present

## 2022-03-07 DIAGNOSIS — T451X5A Adverse effect of antineoplastic and immunosuppressive drugs, initial encounter: Secondary | ICD-10-CM | POA: Diagnosis not present

## 2022-03-07 DIAGNOSIS — Z1159 Encounter for screening for other viral diseases: Secondary | ICD-10-CM | POA: Diagnosis not present

## 2022-03-07 DIAGNOSIS — G893 Neoplasm related pain (acute) (chronic): Secondary | ICD-10-CM | POA: Diagnosis not present

## 2022-03-07 DIAGNOSIS — D508 Other iron deficiency anemias: Secondary | ICD-10-CM | POA: Diagnosis not present

## 2022-03-07 DIAGNOSIS — E063 Autoimmune thyroiditis: Secondary | ICD-10-CM | POA: Diagnosis not present

## 2022-03-07 DIAGNOSIS — C3411 Malignant neoplasm of upper lobe, right bronchus or lung: Secondary | ICD-10-CM | POA: Diagnosis not present

## 2022-03-08 DIAGNOSIS — C3411 Malignant neoplasm of upper lobe, right bronchus or lung: Secondary | ICD-10-CM | POA: Diagnosis not present

## 2022-03-08 DIAGNOSIS — Z79899 Other long term (current) drug therapy: Secondary | ICD-10-CM | POA: Diagnosis not present

## 2022-03-08 DIAGNOSIS — Z1159 Encounter for screening for other viral diseases: Secondary | ICD-10-CM | POA: Diagnosis not present

## 2022-03-08 DIAGNOSIS — Z5112 Encounter for antineoplastic immunotherapy: Secondary | ICD-10-CM | POA: Diagnosis not present

## 2022-03-12 IMAGING — CT NM PET TUM IMG INITIAL (PI) SKULL BASE T - THIGH
1 of 7 series · 1 of 25 positions shown · non-contrast
Comparison: CT chest 09/11/2021

CLINICAL DATA: Initial treatment strategy for lung cancer.

EXAM:
NUCLEAR MEDICINE PET SKULL BASE TO THIGH
TECHNIQUE: 6.9 mCi F-18 FDG was injected intravenously. Full-ring PET imaging
was performed from the skull base to thigh after the radiotracer. CT
data was obtained and used for attenuation correction and anatomic
localization.
Fasting blood glucose: 90 mg/dl

[Series 4: ct sk_thigh 5.0 bf37 · axial · 5.0mm · 0.98mm/px · 1 of 214 slices shown]
[im 214/214  brain]
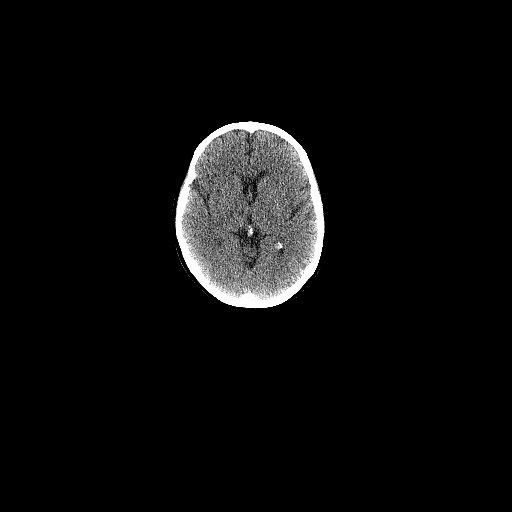

[1 of 25 positions shown; findings below may reference images not displayed]

FINDINGS: Mediastinal blood pool activity: SUV max

Liver activity: SUV max NA

NECK: No hypermetabolic lymph nodes in the neck.

Incidental CT findings: none

CHEST: Spiculated nodule in the LEFT upper lobe measures 2.3 cm with
intense metabolic activity SUV max equal 14.2 (image 4).

Hypermetabolic focus in the anterior LEFT upper lobe associated with
wedge like focus of ground-glass density. Activity is relatively
intense with SUV max equal 5.5 however the pattern of airspace
disease favors infection or inflammation.

No hypermetabolic mediastinal lymph nodes. Intense activity in the
thyroid gland is uniform in favored related thyroiditis

Incidental CT findings: none

ABDOMEN/PELVIS: No abnormal metabolic activity associated with the
adrenal glands. The adrenal glands are enlarged but have low
attenuation consistent benign adrenal adenomas

No hypermetabolic abdominopelvic lymph nodes.

Heterogeneous activity liver without clear focal lesion.

Incidental CT findings: none

SKELETON: There is intense focal radiotracer activity within the T3
vertebral body with SUV max equal 13.2. There is a lucent lytic
lesion within body on the CT portion exam. Second hypermetabolic
lesion in the LEFT sacrum with SUV max equal 10.5. Smudgy sclerotic
lesion on comparison CT

Incidental CT findings: none
IMPRESSION: 1. Spiculated nodule in the RIGHT upper lobe consistent with
bronchogenic carcinoma.
2. Hypermetabolic skeletal metastasis in the T3 vertebral body and
LEFT sacrum.
3. Metabolically active ground-glass density in the LEFT upper lobe
is favored focus of infection or inflammation. Recommend attention
on follow-up.
4. Bilateral adrenal adenomas.

## 2022-03-13 ENCOUNTER — Other Ambulatory Visit: Payer: Self-pay | Admitting: "Endocrinology

## 2022-03-13 DIAGNOSIS — E063 Autoimmune thyroiditis: Secondary | ICD-10-CM

## 2022-03-20 ENCOUNTER — Ambulatory Visit (INDEPENDENT_AMBULATORY_CARE_PROVIDER_SITE_OTHER): Payer: Medicare HMO | Admitting: "Endocrinology

## 2022-03-20 ENCOUNTER — Encounter: Payer: Self-pay | Admitting: "Endocrinology

## 2022-03-20 VITALS — BP 114/76 | HR 84 | Ht 64.0 in | Wt 113.4 lb

## 2022-03-20 DIAGNOSIS — E063 Autoimmune thyroiditis: Secondary | ICD-10-CM | POA: Diagnosis not present

## 2022-03-20 DIAGNOSIS — E038 Other specified hypothyroidism: Secondary | ICD-10-CM

## 2022-03-21 ENCOUNTER — Encounter: Payer: Self-pay | Admitting: "Endocrinology

## 2022-03-21 NOTE — Progress Notes (Signed)
03/21/2022  Endocrinology follow-up note   Subjective:    Patient ID: Carrie Adams, female    DOB: 18-Apr-1956, PCP [name], MD   Past Medical History:  Diagnosis Date   Anxiety    Arthritis    Hypothyroidism    PAD (peripheral artery disease) ([place])    PAD (peripheral artery disease) ([place])    3 stents in groin and 1 in thigh   Stroke ([place])    several, last 2013, balance problems, memory loss occ   Venous thrombosis    Past Surgical History:  Procedure Laterality Date   ABDOMINAL AORTIC ANEURYSM REPAIR  2021   ABDOMINAL HYSTERECTOMY     partial   APPENDECTOMY     with hysterectomy   BIOPSY  06/28/2021   Procedure: BIOPSY;  Surgeon: Harvel Quale, MD;  [facility];  Service: Gastroenterology;;   CAROTID STENT  2018   COLONOSCOPY WITH PROPOFOL N/A 06/28/2021   Procedure: COLONOSCOPY WITH PROPOFOL;  Surgeon: Harvel Quale, MD;  [facility];  Service: Gastroenterology;  Laterality: N/A;  9:05   COSMETIC SURGERY Bilateral    breast implants    ESOPHAGOGASTRODUODENOSCOPY (EGD) WITH PROPOFOL N/A 06/28/2021   Procedure: ESOPHAGOGASTRODUODENOSCOPY (EGD) WITH PROPOFOL;  Surgeon: Harvel Quale, MD;  [facility];  Service: Gastroenterology;  Laterality: N/A;   FEMUR IM NAIL  11/10/2011   Procedure: INTRAMEDULLARY (IM) NAIL FEMORAL;  Surgeon: [name], MD;  Location: [place];  Service: Orthopedics;  Laterality: Left;   HARDWARE REMOVAL Left 04/06/2016   Procedure: HARDWARE REMOVAL LEFT HIP;  Surgeon: Rod Can, MD;  [facility];  Service: Orthopedics;  Laterality: Left;   HOT HEMOSTASIS  06/28/2021   Procedure: HOT HEMOSTASIS (ARGON PLASMA COAGULATION/BICAP);  Surgeon: Montez Morita, Quillian Quince, MD;  [facility];  Service: Gastroenterology;;  small bowel AVMs   POLYPECTOMY  06/28/2021   Procedure: POLYPECTOMY;  Surgeon: Harvel Quale, MD;  [facility];  Service:  Gastroenterology;;   Social History   Socioeconomic History   Marital status: Divorced    Spouse name: Not on file   Number of children: Not on file   Years of education: Not on file   Highest education level: Not on file  Occupational History   Not on file  Tobacco Use   Smoking status: Every Day    Packs/day: 0.50    Years: 33.00    Total pack years: 16.50    Types: Cigarettes   Smokeless tobacco: Never  Vaping Use   Vaping Use: Never used  Substance and Sexual Activity   Alcohol use: No   Drug use: No   Sexual activity: Not on file  Other Topics Concern   Not on file  Social History Narrative   Not on file   Social Determinants of Health   Financial Resource Strain: Not on file  Food Insecurity: Not on file  Transportation Needs: Not on file  Physical Activity: Not on file  Stress: Not on file  Social Connections: Not on file   Outpatient Encounter Medications as of 03/20/2022  Medication Sig   albuterol (VENTOLIN HFA) 108 (90 Base) MCG/ACT inhaler Inhale 1 puff into the lungs every 4 (four) hours as needed.   ALLERGY RELIEF 180 MG tablet Take 1 tablet by mouth daily.   amLODipine (NORVASC) 5 MG tablet Take 1 tablet by mouth daily.   aspirin EC 81 MG tablet Take 81 mg by mouth daily.  calcium carbonate (OS-CAL - DOSED IN MG OF ELEMENTAL CALCIUM) 1250 (500 Ca) MG tablet Take 1 tablet by mouth daily with breakfast.   chlorthalidone (HYGROTON) 25 MG tablet Take 25 mg by mouth daily.   clopidogrel (PLAVIX) 75 MG tablet Take 75 mg by mouth daily.   Cyanocobalamin (VITAMIN B-12 PO) Take 1 tablet by mouth daily.   DULoxetine (CYMBALTA) 60 MG capsule Take 60 mg by mouth 2 (two) times daily.   ferrous sulfate 325 (65 FE) MG tablet Take 325 mg by mouth 3 (three) times daily with meals.   levothyroxine (SYNTHROID) 112 MCG tablet TAKE 1 TABLET(112 MCG) BY MOUTH DAILY BEFORE AND BREAKFAST   metoprolol succinate (TOPROL-XL) 50 MG 24 hr tablet Take 50 mg by mouth 2 (two) times  daily. (Patient not taking: Reported on 03/20/2022)   mupirocin ointment (BACTROBAN) 2 % as needed.   nystatin cream (MYCOSTATIN) Apply topically as needed.   Omega-3 Fatty Acids (FISH OIL) 1000 MG CAPS Take 1 capsule by mouth daily.   omeprazole (PRILOSEC) 40 MG capsule Take 1 capsule (40 mg total) by mouth daily.   OVER THE COUNTER MEDICATION Vit D one po Qd   pravastatin (PRAVACHOL) 20 MG tablet Take 20 mg by mouth at bedtime.   triamcinolone cream (KENALOG) 0.1 % Apply 1 application topically daily.   vitamin C (ASCORBIC ACID) 500 MG tablet Take 500 mg by mouth daily.   No facility-administered encounter medications on file as of 03/20/2022.   ALLERGIES: Allergies  Allergen Reactions   Amoxicillin Nausea And Vomiting    Has patient had a PCN reaction causing immediate rash, facial/tongue/throat swelling, SOB or lightheadedness with hypotension: No Has patient had a PCN reaction causing severe rash involving mucus membranes or skin necrosis: No Has patient had a PCN reaction that required hospitalization No Has patient had a PCN reaction occurring within the last 10 years: No If all of the above answers are "NO", then may proceed with Cephalosporin use.     VACCINATION STATUS: Immunization History  Administered Date(s) Administered   Influenza-Unspecified 05/28/2018    HPI 66 year old female patient with medical history as above.  She is returning for follow-up in the management of her longstanding hypothyroidism from Hashimoto's thyroiditis.   In the interval, she was diagnosed with lung cancer, currently on active treatment with chemotherapy.  She is currently on a stable dose of levothyroxine 112 mcg p.o. daily before breakfast.    She reports compliance and consistency taking her medication.  She has no new complaints today.  Her previsit thyroid function tests are consistent with slight under replacement.  Her 2018 thyroid ultrasound was unremarkable.  Repeat ultrasound on  February 19, 2020 showed similar heterogeneity of thyroid parenchyma suggestive of chronic thyroiditis.  No focal nodules or hypervascularity. -She presents with mildly fluctuating body weight, denies palpitations, tremors, nor heat intolerance.  - She denies any history of goiter nor thyroid surgery. She denies any history of ablative thyroid therapy. - She has family history of hypothyroidism in one of her sisters as well as in her mother. - She has mostly been light weight most of her adult life. She denies heat/cold intolerance. She is a chronic active smoker, currently being treated for bronchitis.  Review of Systems Limited as above.  Objective:    BP 114/76   Pulse 84   Ht 5\' 4"  (1.626 m)   Wt 113 lb 6.4 oz (51.4 kg)   BMI 19.47 kg/m   Wt Readings from Last 3  Encounters:  03/20/22 113 lb 6.4 oz (51.4 kg)  11/14/21 126 lb 1.6 oz (57.2 kg)  08/30/21 138 lb 12.8 oz (63 kg)     Physical Exam- Limited  Constitutional:  Body mass index is 19.47 kg/m. , not in acute distress,   TSH from 08/18/2016 was 2.4. 08/14/2017: TSH 2.2, free T4 1. 3 Labs from February 13, 2018 showed TSH 0.55, free T4 1.4 9  September 23, 2018 labs TSH 0-7 1, free T4 1.53 February 20, 2019 total T4 9.3 No results found for this or any previous visit (from the past 2160 hour(s)).    Assessment & Plan:   1. Hypothyroidism 2. Hashimoto's thyroiditis  -Her previsit thyroid function tests are consistent with appropriate replacement.  She is advised to continue levothyroxine 112 mcg p.o. daily before breakfast.   - We discussed about the correct intake of her thyroid hormone, on empty stomach at fasting, with water, separated by at least 30 minutes from breakfast and other medications,  and separated by more than 4 hours from calcium, iron, multivitamins, acid reflux medications (PPIs). -Patient is made aware of the fact that thyroid hormone replacement is needed for life, dose to be adjusted by periodic monitoring of  thyroid function tests.   - Her recent  thyroid ultrasound is consistent with heterogeneous thyroid parenchyma suggestive of chronic thyroiditis with no focal nodules.    This is consistent with heterogeneous texture from chronic thyroiditis/Hashimoto's thyroiditis.  She will not need intervention for now.   - I advised patient to maintain close follow up with [name], MD for primary care needs.   I spent 21 minutes in the care of the patient today including review of labs from Thyroid Function, CMP, and other relevant labs ; imaging/biopsy records (current and previous including abstractions from other facilities); face-to-face time discussing  her lab results and symptoms, medications doses, her options of short and long term treatment based on the latest standards of care / guidelines;   and documenting the encounter.  Carrie Adams  participated in the discussions, expressed understanding, and voiced agreement with the above plans.  All questions were answered to her satisfaction. she is encouraged to contact clinic should she have any questions or concerns prior to her return visit.   Follow up plan: Return in about 6 months (around 09/20/2022) for F/U with Pre-visit Labs.  Glade Lloyd, MD Phone: [phone]  Fax: [phone]   -  This note was partially dictated with voice recognition software. Similar sounding words can be transcribed inadequately or may not  be corrected upon review.  03/21/2022, 7:44 PM

## 2022-04-04 DIAGNOSIS — E038 Other specified hypothyroidism: Secondary | ICD-10-CM | POA: Diagnosis not present

## 2022-04-04 DIAGNOSIS — C3411 Malignant neoplasm of upper lobe, right bronchus or lung: Secondary | ICD-10-CM | POA: Diagnosis not present

## 2022-04-04 DIAGNOSIS — D508 Other iron deficiency anemias: Secondary | ICD-10-CM | POA: Diagnosis not present

## 2022-04-04 DIAGNOSIS — G893 Neoplasm related pain (acute) (chronic): Secondary | ICD-10-CM | POA: Diagnosis not present

## 2022-04-04 DIAGNOSIS — D649 Anemia, unspecified: Secondary | ICD-10-CM | POA: Diagnosis not present

## 2022-04-04 DIAGNOSIS — Z79899 Other long term (current) drug therapy: Secondary | ICD-10-CM | POA: Diagnosis not present

## 2022-04-04 DIAGNOSIS — Z1159 Encounter for screening for other viral diseases: Secondary | ICD-10-CM | POA: Diagnosis not present

## 2022-04-08 DIAGNOSIS — D509 Iron deficiency anemia, unspecified: Secondary | ICD-10-CM | POA: Diagnosis not present

## 2022-04-08 DIAGNOSIS — R0603 Acute respiratory distress: Secondary | ICD-10-CM | POA: Diagnosis not present

## 2022-04-08 DIAGNOSIS — Z20822 Contact with and (suspected) exposure to covid-19: Secondary | ICD-10-CM | POA: Diagnosis not present

## 2022-04-08 DIAGNOSIS — Z743 Need for continuous supervision: Secondary | ICD-10-CM | POA: Diagnosis not present

## 2022-04-08 DIAGNOSIS — J189 Pneumonia, unspecified organism: Secondary | ICD-10-CM | POA: Diagnosis not present

## 2022-04-08 DIAGNOSIS — E7800 Pure hypercholesterolemia, unspecified: Secondary | ICD-10-CM | POA: Diagnosis not present

## 2022-04-08 DIAGNOSIS — G893 Neoplasm related pain (acute) (chronic): Secondary | ICD-10-CM | POA: Diagnosis not present

## 2022-04-08 DIAGNOSIS — M8448XD Pathological fracture, other site, subsequent encounter for fracture with routine healing: Secondary | ICD-10-CM | POA: Diagnosis not present

## 2022-04-08 DIAGNOSIS — R059 Cough, unspecified: Secondary | ICD-10-CM | POA: Diagnosis not present

## 2022-04-08 DIAGNOSIS — R4182 Altered mental status, unspecified: Secondary | ICD-10-CM | POA: Diagnosis not present

## 2022-04-08 DIAGNOSIS — I959 Hypotension, unspecified: Secondary | ICD-10-CM | POA: Diagnosis not present

## 2022-04-08 DIAGNOSIS — Z7902 Long term (current) use of antithrombotics/antiplatelets: Secondary | ICD-10-CM | POA: Diagnosis not present

## 2022-04-08 DIAGNOSIS — C7951 Secondary malignant neoplasm of bone: Secondary | ICD-10-CM | POA: Diagnosis not present

## 2022-04-08 DIAGNOSIS — R0902 Hypoxemia: Secondary | ICD-10-CM | POA: Diagnosis not present

## 2022-04-08 DIAGNOSIS — R0602 Shortness of breath: Secondary | ICD-10-CM | POA: Diagnosis not present

## 2022-04-08 DIAGNOSIS — E440 Moderate protein-calorie malnutrition: Secondary | ICD-10-CM | POA: Diagnosis not present

## 2022-04-08 DIAGNOSIS — R410 Disorientation, unspecified: Secondary | ICD-10-CM | POA: Diagnosis not present

## 2022-04-08 DIAGNOSIS — C3490 Malignant neoplasm of unspecified part of unspecified bronchus or lung: Secondary | ICD-10-CM | POA: Diagnosis not present

## 2022-04-08 DIAGNOSIS — I10 Essential (primary) hypertension: Secondary | ICD-10-CM | POA: Diagnosis not present

## 2022-04-08 DIAGNOSIS — Z6820 Body mass index (BMI) 20.0-20.9, adult: Secondary | ICD-10-CM | POA: Diagnosis not present

## 2022-04-08 DIAGNOSIS — I6381 Other cerebral infarction due to occlusion or stenosis of small artery: Secondary | ICD-10-CM | POA: Diagnosis not present

## 2022-04-08 DIAGNOSIS — Z66 Do not resuscitate: Secondary | ICD-10-CM | POA: Diagnosis not present

## 2022-04-08 DIAGNOSIS — G928 Other toxic encephalopathy: Secondary | ICD-10-CM | POA: Diagnosis not present

## 2022-04-08 DIAGNOSIS — K769 Liver disease, unspecified: Secondary | ICD-10-CM | POA: Diagnosis not present

## 2022-04-08 DIAGNOSIS — E039 Hypothyroidism, unspecified: Secondary | ICD-10-CM | POA: Diagnosis not present

## 2022-04-08 DIAGNOSIS — R404 Transient alteration of awareness: Secondary | ICD-10-CM | POA: Diagnosis not present

## 2022-04-08 DIAGNOSIS — R079 Chest pain, unspecified: Secondary | ICD-10-CM | POA: Diagnosis not present

## 2022-04-08 DIAGNOSIS — E876 Hypokalemia: Secondary | ICD-10-CM | POA: Diagnosis not present

## 2022-04-08 DIAGNOSIS — J440 Chronic obstructive pulmonary disease with acute lower respiratory infection: Secondary | ICD-10-CM | POA: Diagnosis not present

## 2022-04-08 DIAGNOSIS — R64 Cachexia: Secondary | ICD-10-CM | POA: Diagnosis not present

## 2022-04-08 DIAGNOSIS — Z7982 Long term (current) use of aspirin: Secondary | ICD-10-CM | POA: Diagnosis not present

## 2022-04-08 DIAGNOSIS — R339 Retention of urine, unspecified: Secondary | ICD-10-CM | POA: Diagnosis not present

## 2022-04-08 DIAGNOSIS — F17210 Nicotine dependence, cigarettes, uncomplicated: Secondary | ICD-10-CM | POA: Diagnosis not present

## 2022-04-11 DIAGNOSIS — R4182 Altered mental status, unspecified: Secondary | ICD-10-CM | POA: Diagnosis not present

## 2022-04-11 DIAGNOSIS — R0902 Hypoxemia: Secondary | ICD-10-CM | POA: Diagnosis not present

## 2022-04-11 DIAGNOSIS — E440 Moderate protein-calorie malnutrition: Secondary | ICD-10-CM | POA: Diagnosis not present

## 2022-04-11 DIAGNOSIS — R64 Cachexia: Secondary | ICD-10-CM | POA: Diagnosis not present

## 2022-04-11 DIAGNOSIS — M8448XD Pathological fracture, other site, subsequent encounter for fracture with routine healing: Secondary | ICD-10-CM | POA: Diagnosis not present

## 2022-04-11 DIAGNOSIS — R0603 Acute respiratory distress: Secondary | ICD-10-CM | POA: Diagnosis not present

## 2022-04-11 DIAGNOSIS — G928 Other toxic encephalopathy: Secondary | ICD-10-CM | POA: Diagnosis not present

## 2022-04-11 DIAGNOSIS — C7951 Secondary malignant neoplasm of bone: Secondary | ICD-10-CM | POA: Diagnosis not present

## 2022-04-11 DIAGNOSIS — C3490 Malignant neoplasm of unspecified part of unspecified bronchus or lung: Secondary | ICD-10-CM | POA: Diagnosis not present

## 2022-04-11 DIAGNOSIS — G893 Neoplasm related pain (acute) (chronic): Secondary | ICD-10-CM | POA: Diagnosis not present

## 2022-04-11 DIAGNOSIS — D509 Iron deficiency anemia, unspecified: Secondary | ICD-10-CM | POA: Diagnosis not present

## 2022-04-11 DIAGNOSIS — E039 Hypothyroidism, unspecified: Secondary | ICD-10-CM | POA: Diagnosis not present

## 2022-06-20 ENCOUNTER — Encounter (INDEPENDENT_AMBULATORY_CARE_PROVIDER_SITE_OTHER): Payer: Self-pay | Admitting: *Deleted

## 2022-09-20 ENCOUNTER — Ambulatory Visit: Payer: Medicare HMO | Admitting: "Endocrinology

## 2023-08-09 ENCOUNTER — Encounter: Payer: Self-pay | Admitting: "Endocrinology
